# Patient Record
Sex: Female | Born: 1985 | Hispanic: Yes | Marital: Single | Smoking: Never smoker
Health system: Southern US, Community
[De-identification: ages and names within clinical notes are randomized; demographics above are authoritative.]

## PROBLEM LIST (undated history)

## (undated) HISTORY — PX: CHOLECYSTECTOMY: SHX55

## (undated) HISTORY — PX: TUBAL LIGATION: SHX77

---

## 2005-02-02 ENCOUNTER — Emergency Department: Payer: Self-pay | Admitting: Emergency Medicine

## 2005-08-25 ENCOUNTER — Ambulatory Visit: Payer: Self-pay | Admitting: Family Medicine

## 2009-12-20 ENCOUNTER — Emergency Department: Payer: Self-pay | Admitting: Internal Medicine

## 2010-03-10 ENCOUNTER — Emergency Department: Payer: Self-pay | Admitting: Unknown Physician Specialty

## 2010-03-28 ENCOUNTER — Observation Stay: Payer: Self-pay | Admitting: Obstetrics and Gynecology

## 2010-10-18 ENCOUNTER — Inpatient Hospital Stay: Payer: Self-pay

## 2010-12-24 ENCOUNTER — Emergency Department: Payer: Self-pay | Admitting: Internal Medicine

## 2011-07-28 ENCOUNTER — Emergency Department: Payer: Self-pay | Admitting: Emergency Medicine

## 2011-07-28 LAB — URINALYSIS, COMPLETE
Bacteria: NONE SEEN
Blood: NEGATIVE
Ph: 8 (ref 4.5–8.0)
Protein: NEGATIVE
RBC,UR: <1 /HPF (ref 0–5)
Specific Gravity: 1.02 (ref 1.003–1.030)
Squamous Epithelial: 7
WBC UR: 1 /HPF (ref 0–5)

## 2011-07-28 LAB — PREGNANCY, URINE: Pregnancy Test, Urine: POSITIVE m[IU]/mL

## 2011-10-20 ENCOUNTER — Inpatient Hospital Stay: Payer: Self-pay

## 2011-10-20 LAB — URINALYSIS, COMPLETE
Bacteria: NONE SEEN
Glucose,UR: NEGATIVE mg/dL (ref 0–75)
Nitrite: NEGATIVE
Protein: 100
Specific Gravity: 1.011 (ref 1.003–1.030)
Squamous Epithelial: 1
WBC UR: 610 /HPF (ref 0–5)

## 2011-10-21 LAB — CBC WITH DIFFERENTIAL/PLATELET
Basophil #: 0 10*3/uL (ref 0.0–0.1)
Basophil %: 0.3 %
Eosinophil %: 0.2 %
HCT: 31.3 % — ABNORMAL LOW (ref 35.0–47.0)
Lymphocyte #: 1.4 10*3/uL (ref 1.0–3.6)
Lymphocyte %: 9.7 %
MCH: 28.2 pg (ref 26.0–34.0)
MCHC: 33.2 g/dL (ref 32.0–36.0)
Monocyte #: 1.1 x10 3/mm — ABNORMAL HIGH (ref 0.2–0.9)
Neutrophil %: 82.3 %
RBC: 3.69 10*6/uL — ABNORMAL LOW (ref 3.80–5.20)
RDW: 14.7 % — ABNORMAL HIGH (ref 11.5–14.5)
WBC: 14.4 10*3/uL — ABNORMAL HIGH (ref 3.6–11.0)

## 2011-10-22 LAB — URINE CULTURE

## 2014-10-14 ENCOUNTER — Emergency Department
Admit: 2014-10-14 | Disposition: A | Discharge status: Home / Self Care | Payer: Self-pay | Admitting: Emergency Medicine

## 2014-10-14 LAB — CBC
HCT: 40.7 % (ref 35.0–47.0)
HGB: 13.5 g/dL (ref 12.0–16.0)
MCH: 29.2 pg (ref 26.0–34.0)
MCHC: 33.2 g/dL (ref 32.0–36.0)
MCV: 88 fL (ref 80–100)
Platelet: 237 10*3/uL (ref 150–440)
RBC: 4.64 10*6/uL (ref 3.80–5.20)
RDW: 12.9 % (ref 11.5–14.5)
WBC: 8.9 10*3/uL (ref 3.6–11.0)

## 2014-10-14 LAB — COMPREHENSIVE METABOLIC PANEL
ALT: 61 U/L — AB
AST: 54 U/L — AB
Albumin: 4.4 g/dL
Alkaline Phosphatase: 65 U/L
Anion Gap: 8 (ref 7–16)
BUN: 16 mg/dL
Bilirubin,Total: 0.6 mg/dL
CHLORIDE: 105 mmol/L
CO2: 28 mmol/L
Calcium, Total: 9.5 mg/dL
Creatinine: 0.66 mg/dL
Glucose: 93 mg/dL
POTASSIUM: 3.8 mmol/L
Sodium: 141 mmol/L
Total Protein: 7.8 g/dL

## 2014-10-14 LAB — URINALYSIS, COMPLETE
BILIRUBIN, UR: NEGATIVE
BLOOD: NEGATIVE
Glucose,UR: NEGATIVE mg/dL (ref 0–75)
Ketone: NEGATIVE
LEUKOCYTE ESTERASE: NEGATIVE
NITRITE: NEGATIVE
Ph: 6 (ref 4.5–8.0)
Protein: NEGATIVE
Specific Gravity: 1.025 (ref 1.003–1.030)

## 2014-10-14 LAB — PREGNANCY, URINE: Pregnancy Test, Urine: NEGATIVE m[IU]/mL

## 2014-10-30 NOTE — H&P (Signed)
L&D Evaluation:  History:   HPI 29 yo [id number] @ 26.1wks EDC 12/21/11 by 26wk uls presents c/o right flank pain x 1 day.  She reports a h/o "kidney infections" with 4 of her prior pregnancies.  She has not had a UTI this pregnancy.  Pain started last night and has been worsening since.  She is also having urinary frequency and burning, using the bathroom q 5-10 min.  Reports pain so bad today that it was hard for her to walk.  +FM, no LOF/VB or ctxs.  PNC @ [facility] c/b multiparity, h/o term fetal demise, PTD x 2, anti-C antibodies and late PNC started at 26wks.  No nausea/vomiting/fever/chills.  PNLs reviewed and all normal    Presents with back pain    Patient's Medical History Anti-C antibodies, Anemia, Pyelonephritis, Abnormal pap    Patient's Surgical History none    Medications Pre Natal Vitamins    Allergies NKDA    Social History none    Family History DM, HTN - pat GM   ROS:   ROS see HPI, all others reviewed and neg   Exam:   Vital Signs stable  T 99.1    Urine Protein UA:  1+bld, prot 100, nitr neg, 14 RBCs, 610 WBCs, 1 epith    General Obvious discomfort, holding her R side    Mental Status clear    Chest clear    Heart normal sinus rhythm    Abdomen gravid, non-tender    Back Exquisite R flank pain    Edema no edema    Pelvic Deferred    Mebranes Intact    FHT normal rate with no decels, reactive    Fetal Heart Rate 135     Ucx absent    Skin dry   Impression:   Impression 26.1wk IUP, pyelonephritis   Plan:   Comments 1)  Admit for antibiotics and pain control.  Ancef 2g IV q 24hrs started pending culture.  Percocet prn pain.  2 tabs given now. 2)  CBC drawn. 3)  Transfer to floor when pain improved.   Electronic Signatures: Senaida LangeWeaver-Lee, Eulis Salazar (MD)  (Signed 01-May-13 00:08)  Authored: L&D Evaluation   Last Updated: 01-May-13 00:08 by Senaida LangeWeaver-Lee, Byren Pankow (MD)

## 2019-03-04 DIAGNOSIS — R7989 Other specified abnormal findings of blood chemistry: Secondary | ICD-10-CM | POA: Insufficient documentation

## 2019-03-04 DIAGNOSIS — U071 COVID-19: Secondary | ICD-10-CM | POA: Insufficient documentation

## 2019-03-04 DIAGNOSIS — E669 Obesity, unspecified: Secondary | ICD-10-CM | POA: Insufficient documentation

## 2020-04-02 ENCOUNTER — Inpatient Hospital Stay
Admission: EM | Admit: 2020-04-02 | Discharge: 2020-04-05 | DRG: 419 | Disposition: A | Discharge status: Home / Self Care | Payer: Medicaid Other | Attending: Surgery | Admitting: Surgery

## 2020-04-02 ENCOUNTER — Emergency Department: Payer: Medicaid Other

## 2020-04-02 ENCOUNTER — Other Ambulatory Visit: Payer: Self-pay

## 2020-04-02 DIAGNOSIS — Z6834 Body mass index (BMI) 34.0-34.9, adult: Secondary | ICD-10-CM | POA: Diagnosis not present

## 2020-04-02 DIAGNOSIS — Z9851 Tubal ligation status: Secondary | ICD-10-CM

## 2020-04-02 DIAGNOSIS — R52 Pain, unspecified: Secondary | ICD-10-CM

## 2020-04-02 DIAGNOSIS — K810 Acute cholecystitis: Secondary | ICD-10-CM | POA: Diagnosis not present

## 2020-04-02 DIAGNOSIS — R1011 Right upper quadrant pain: Secondary | ICD-10-CM

## 2020-04-02 DIAGNOSIS — Z20822 Contact with and (suspected) exposure to covid-19: Secondary | ICD-10-CM | POA: Diagnosis present

## 2020-04-02 DIAGNOSIS — E669 Obesity, unspecified: Secondary | ICD-10-CM | POA: Diagnosis present

## 2020-04-02 DIAGNOSIS — K8000 Calculus of gallbladder with acute cholecystitis without obstruction: Principal | ICD-10-CM | POA: Diagnosis present

## 2020-04-02 DIAGNOSIS — K660 Peritoneal adhesions (postprocedural) (postinfection): Secondary | ICD-10-CM | POA: Diagnosis present

## 2020-04-02 LAB — COMPREHENSIVE METABOLIC PANEL
ALT: 84 U/L — ABNORMAL HIGH (ref 0–44)
AST: 135 U/L — ABNORMAL HIGH (ref 15–41)
Albumin: 4.5 g/dL (ref 3.5–5.0)
Alkaline Phosphatase: 85 U/L (ref 38–126)
Anion gap: 9 (ref 5–15)
BUN: 16 mg/dL (ref 6–20)
CO2: 25 mmol/L (ref 22–32)
Calcium: 9.3 mg/dL (ref 8.9–10.3)
Chloride: 101 mmol/L (ref 98–111)
Creatinine, Ser: 0.72 mg/dL (ref 0.44–1.00)
GFR, Estimated: >60 mL/min (ref >60)
Glucose, Bld: 117 mg/dL — ABNORMAL HIGH (ref 70–99)
Potassium: 3.8 mmol/L (ref 3.5–5.1)
Sodium: 135 mmol/L (ref 135–145)
Total Bilirubin: 1.6 mg/dL — ABNORMAL HIGH (ref 0.3–1.2)
Total Protein: 8 g/dL (ref 6.5–8.1)

## 2020-04-02 LAB — RESPIRATORY PANEL BY RT PCR (FLU A&B, COVID)
Influenza A by PCR: NEGATIVE
Influenza B by PCR: NEGATIVE
SARS Coronavirus 2 by RT PCR: NEGATIVE

## 2020-04-02 LAB — CBC
HCT: 39.4 % (ref 36.0–46.0)
Hemoglobin: 13.3 g/dL (ref 12.0–15.0)
MCH: 29.4 pg (ref 26.0–34.0)
MCHC: 33.8 g/dL (ref 30.0–36.0)
MCV: 87.2 fL (ref 80.0–100.0)
Platelets: 245 10*3/uL (ref 150–400)
RBC: 4.52 MIL/uL (ref 3.87–5.11)
RDW: 12.7 % (ref 11.5–15.5)
WBC: 15.9 10*3/uL — ABNORMAL HIGH (ref 4.0–10.5)
nRBC: 0 % (ref 0.0–0.2)

## 2020-04-02 LAB — LIPASE, BLOOD: Lipase: 28 U/L (ref 11–51)

## 2020-04-02 MED ORDER — HYDROMORPHONE HCL 1 MG/ML IJ SOLN
0.5000 mg | INTRAMUSCULAR | Status: DC | PRN
  Administered 2020-04-03 – 2020-04-04 (×5): 0.5 mg via INTRAVENOUS
  Filled 2020-04-02: qty 1
  Filled 2020-04-02: qty 0.5
  Filled 2020-04-02 (×2): qty 1
  Filled 2020-04-02: qty 0.5

## 2020-04-02 MED ORDER — HYDROMORPHONE HCL 1 MG/ML IJ SOLN
1.0000 mg | Freq: Once | INTRAMUSCULAR | Status: AC
  Administered 2020-04-02: 1 mg via INTRAVENOUS
  Filled 2020-04-02: qty 1

## 2020-04-02 MED ORDER — ONDANSETRON HCL 4 MG/2ML IJ SOLN: 4.0000 mg | Freq: Four times a day (QID) | INTRAMUSCULAR | Status: DC | PRN

## 2020-04-02 MED ORDER — ONDANSETRON HCL 4 MG/2ML IJ SOLN
4.0000 mg | Freq: Once | INTRAMUSCULAR | Status: AC
  Administered 2020-04-02: 4 mg via INTRAVENOUS
  Filled 2020-04-02: qty 2

## 2020-04-02 MED ORDER — ACETAMINOPHEN 500 MG PO TABS
1000.0000 mg | ORAL_TABLET | Freq: Four times a day (QID) | ORAL | Status: DC
  Administered 2020-04-02 – 2020-04-05 (×8): 1000 mg via ORAL
  Filled 2020-04-02 (×9): qty 2

## 2020-04-02 MED ORDER — LACTATED RINGERS IV SOLN
125.0000 mL/h | INTRAVENOUS | Status: DC
  Administered 2020-04-02 – 2020-04-05 (×3): 125 mL/h via INTRAVENOUS

## 2020-04-02 MED ORDER — FENTANYL CITRATE (PF) 100 MCG/2ML IJ SOLN
INTRAMUSCULAR | Status: AC
  Administered 2020-04-02: 50 ug via NASAL
  Filled 2020-04-02: qty 2

## 2020-04-02 MED ORDER — ONDANSETRON 4 MG PO TBDP: 4.0000 mg | ORAL_TABLET | Freq: Four times a day (QID) | ORAL | Status: DC | PRN

## 2020-04-02 MED ORDER — ENOXAPARIN SODIUM 40 MG/0.4ML ~~LOC~~ SOLN: 40.0000 mg | SUBCUTANEOUS | Status: DC

## 2020-04-02 MED ORDER — SODIUM CHLORIDE 0.9 % IV BOLUS
1000.0000 mL | Freq: Once | INTRAVENOUS | Status: AC
  Administered 2020-04-02: 1000 mL via INTRAVENOUS

## 2020-04-02 MED ORDER — PIPERACILLIN-TAZOBACTAM 3.375 G IVPB
3.3750 g | Freq: Three times a day (TID) | INTRAVENOUS | Status: DC
  Administered 2020-04-03 – 2020-04-05 (×6): 3.375 g via INTRAVENOUS
  Filled 2020-04-02 (×6): qty 50

## 2020-04-02 MED ORDER — PIPERACILLIN-TAZOBACTAM 3.375 G IVPB 30 MIN
3.3750 g | Freq: Once | INTRAVENOUS | Status: AC
  Administered 2020-04-02: 3.375 g via INTRAVENOUS
  Filled 2020-04-02: qty 50

## 2020-04-02 MED ORDER — PANTOPRAZOLE SODIUM 40 MG IV SOLR
40.0000 mg | Freq: Every day | INTRAVENOUS | Status: DC
  Administered 2020-04-02 – 2020-04-04 (×3): 40 mg via INTRAVENOUS
  Filled 2020-04-02 (×4): qty 40

## 2020-04-02 MED ORDER — FENTANYL CITRATE (PF) 100 MCG/2ML IJ SOLN: 50.0000 ug | INTRAMUSCULAR | Status: DC | PRN

## 2020-04-02 MED ORDER — POLYETHYLENE GLYCOL 3350 17 G PO PACK: 17.0000 g | PACK | Freq: Every day | ORAL | Status: DC | PRN

## 2020-04-02 MED ORDER — KETOROLAC TROMETHAMINE 30 MG/ML IJ SOLN
30.0000 mg | Freq: Four times a day (QID) | INTRAMUSCULAR | Status: DC
  Administered 2020-04-02 – 2020-04-03 (×3): 30 mg via INTRAVENOUS
  Filled 2020-04-02 (×3): qty 1

## 2020-04-02 NOTE — Progress Notes (Signed)
04/02/20  Called by Dr. Erma Heritage about this patient.  Presented with epigastric/RUQ abdominal pain after eating [employer] yesterday.  Labwork showed WBC of 15.9, with total bili of 1.6, AST 135, ALT 84, AlkPhos 85, lipase 28.  U/S showed acute cholecystitis with gallbladder wall thickening and 2 cm stone lodged in the neck of the gallbladder.  CBD is 7 mm, but there's no gross biliary dilatation or CBD filling defects.  Will admit to surgery, check labs in AM, possibly schedule for cholecystectomy.  Full H&P to follow in AM.  Henrene Dodge, MD

## 2020-04-02 NOTE — ED Triage Notes (Signed)
Pt was seen at [facility] for low back pain yesterday, prescribed muscle relaxer's which made pt sick. PT states now pain is in epigastric region. PT fidgeting and groaning, clearly uncomfortable. PT states only N/V was after medicine. NO urinary or vaginal sx.

## 2020-04-02 NOTE — ED Provider Notes (Signed)
[facility] Emergency Department Provider Note  ____________________________________________   First MD Initiated Contact with Patient 04/02/20 2121     (approximate)  I have reviewed the triage vital signs and the nursing notes.   HISTORY  Chief Complaint Abdominal Pain and Back Pain    HPI Carol Wilkerson is a 34 y.o. female  Here with abd pain. Pt reports that for the past several months, she's been having intermittent epigastric/RUQ abd pain, worse after eating. Has been to outside ED multiple times and dx with MSk pain, for which she has been having PT and been on muscle relaxants. Reports she ate [employer] yesterday, and after that had acute onset severe aching, gnawing epigastric abd pain. She has had associated nausea, no emesis. Pain is worse w/ attempted eating, drinking. No alleviating factors. She went to OSH ED overnight and was dx with MSK pain, given meds and sent home. She's had ongoing pain since then. No known fevers.        History reviewed. No pertinent past medical history.  Patient Active Problem List   Diagnosis Date Noted  . Acute cholecystitis 04/02/2020    Past Surgical History:  Procedure Laterality Date  . TUBAL LIGATION      Prior to Admission medications   Not on File    Allergies Patient has no known allergies.  No family history on file.  Social History Social History   Tobacco Use  . Smoking status: Never Smoker  . Smokeless tobacco: Never Used  Substance Use Topics  . Alcohol use: Never  . Drug use: Never    Review of Systems  Review of Systems  Constitutional: Positive for fatigue. Negative for fever.  HENT: Negative for congestion and sore throat.   Eyes: Negative for visual disturbance.  Respiratory: Negative for cough and shortness of breath.   Cardiovascular: Negative for chest pain.  Gastrointestinal: Positive for abdominal pain, nausea and vomiting. Negative for diarrhea.    Genitourinary: Negative for flank pain.  Musculoskeletal: Negative for back pain and neck pain.  Skin: Negative for rash and wound.  Neurological: Negative for weakness.  All other systems reviewed and are negative.    ____________________________________________  PHYSICAL EXAM:      VITAL SIGNS: ED Triage Vitals  Enc Vitals Group     BP 04/02/20 1757 121/70     Pulse Rate 04/02/20 1757 66     Resp 04/02/20 1757 18     Temp 04/02/20 1757 98.8 F (37.1 C)     Temp Source 04/02/20 1757 Oral     SpO2 04/02/20 1757 99 %     Weight 04/02/20 1801 170 lb (77.1 kg)     Height 04/02/20 1801 4\' 11"  (1.499 m)     Head Circumference --      Peak Flow --      Pain Score 04/02/20 1801 10     Pain Loc --      Pain Edu? --      Excl. in GC? --      Physical Exam Vitals and nursing note reviewed.  Constitutional:      General: She is not in acute distress.    Appearance: She is well-developed.  HENT:     Head: Normocephalic and atraumatic.  Eyes:     Conjunctiva/sclera: Conjunctivae normal.  Cardiovascular:     Rate and Rhythm: Normal rate and regular rhythm.     Heart sounds: Normal heart sounds. No murmur heard.  No friction  rub.  Pulmonary:     Effort: Pulmonary effort is normal. No respiratory distress.     Breath sounds: Normal breath sounds. No wheezing or rales.  Abdominal:     General: There is no distension.     Palpations: Abdomen is soft.     Tenderness: There is abdominal tenderness in the right upper quadrant. Positive signs include Murphy's sign.  Musculoskeletal:     Cervical back: Neck supple.  Skin:    General: Skin is warm.     Capillary Refill: Capillary refill takes less than 2 seconds.  Neurological:     Mental Status: She is alert and oriented to person, place, and time.     Motor: No abnormal muscle tone.       ____________________________________________   LABS (all labs ordered are listed, but only abnormal results are displayed)  Labs  Reviewed  COMPREHENSIVE METABOLIC PANEL - Abnormal; Notable for the following components:      Result Value   Glucose, Bld 117 (*)    AST 135 (*)    ALT 84 (*)    Total Bilirubin 1.6 (*)    All other components within normal limits  CBC - Abnormal; Notable for the following components:   WBC 15.9 (*)    All other components within normal limits  RESPIRATORY PANEL BY RT PCR (FLU A&B, COVID)  LIPASE, BLOOD  URINALYSIS, COMPLETE (UACMP) WITH MICROSCOPIC  HIV ANTIBODY (ROUTINE TESTING W REFLEX)  MAGNESIUM  CBC  BASIC METABOLIC PANEL  HEPATIC FUNCTION PANEL  POC URINE PREG, ED    ____________________________________________  EKG:  ________________________________________  RADIOLOGY All imaging, including plain films, CT scans, and ultrasounds, independently reviewed by me, and interpretations confirmed via formal radiology reads.  ED MD interpretation:   [place]: 2 cm stone in GB neck, non mobile, with signs of cholecystitis  Official radiology report(s): US Abdomen Limited RUQ  Result Date: 04/02/2020 CLINICAL DATA:  Two days of pain, nausea and vomiting EXAM: ULTRASOUND ABDOMEN LIMITED RIGHT UPPER QUADRANT COMPARISON:  Abdominal ultrasound 08/25/2005 FINDINGS: Gallbladder: Calcified, shadowing gallstone measuring up to 2 cm positioned within the neck of the gallbladder is nonmobile and patient repositioning. Gallbladder wall thickening to 4.4 mm. Sonographic Eulah Pont sign is reportedly positive. No pericholecystic fluid is visible. Common bile duct: Diameter: 7 mm, borderline dilated. No intrahepatic biliary ductal dilatation is seen. Liver: Diffusely increased hepatic echogenicity with loss of definition of the portal triads and diminished posterior through transmission compatible with hepatic steatosis. No focal lesion identified. Portal vein is patent on color Doppler imaging with normal direction of blood flow towards the liver. Other: None. IMPRESSION: 1. Non-mobile 2 cm stone in the  neck of the gallbladder with gallbladder wall thickening and a positive sonographic Murphy sign. Findings are compatible with cholelithiasis and acute cholecystitis in the appropriate clinical setting. 2. Borderline dilatation of the common bile duct. No intrahepatic biliary ductal dilatation is seen. 3. Increased hepatic echogenicity, most often compatible with hepatic steatosis. Electronically Signed   By: Kreg Shropshire M.D.   On: 04/02/2020 19:08    ____________________________________________  PROCEDURES   Procedure(s) performed (including Critical Care):  Procedures  ____________________________________________  INITIAL IMPRESSION / MDM / ASSESSMENT AND PLAN / ED COURSE  As part of my medical decision making, I reviewed the following data within the electronic MEDICAL RECORD NUMBER Nursing notes reviewed and incorporated, Old chart reviewed, Notes from prior ED visits, and [place] Controlled Substance Database       *Renay Crammer was  evaluated in Emergency Department on 04/02/2020 for the symptoms described in the history of present illness. She was evaluated in the context of the global COVID-19 pandemic, which necessitated consideration that the patient might be at risk for infection with the SARS-CoV-2 virus that causes COVID-19. Institutional protocols and algorithms that pertain to the evaluation of patients at risk for COVID-19 are in a state of rapid change based on information released by regulatory bodies including the CDC and federal and state organizations. These policies and algorithms were followed during the patient's care in the ED.  Some ED evaluations and interventions may be delayed as a result of limited staffing during the pandemic.*     Medical Decision Making:  34 yo F here with RUQ pain, likely 2/2 biliary colic with signs of early cholecystitis. Labs show mild LFT elevation, leukocytosis. Lipase wnl. CBD slightly dilated. Discussed with Dr. Aleen Campi, will start on  empiric ABX, fluids, and admit. COVID pending.  ____________________________________________  FINAL CLINICAL IMPRESSION(S) / ED DIAGNOSES  Final diagnoses:  Pain  Acute cholecystitis     MEDICATIONS GIVEN DURING THIS VISIT:  Medications  fentaNYL (SUBLIMAZE) injection 50 mcg (50 mcg Nasal Given 04/02/20 1832)  piperacillin-tazobactam (ZOSYN) IVPB 3.375 g (3.375 g Intravenous New Bag/Given 04/02/20 2202)  lactated ringers infusion (has no administration in time range)  acetaminophen (TYLENOL) tablet 1,000 mg (has no administration in time range)  ketorolac (TORADOL) 30 MG/ML injection 30 mg (has no administration in time range)  HYDROmorphone (DILAUDID) injection 0.5 mg (has no administration in time range)  polyethylene glycol (MIRALAX / GLYCOLAX) packet 17 g (has no administration in time range)  ondansetron (ZOFRAN-ODT) disintegrating tablet 4 mg (has no administration in time range)    Or  ondansetron (ZOFRAN) injection 4 mg (has no administration in time range)  pantoprazole (PROTONIX) injection 40 mg (has no administration in time range)  enoxaparin (LOVENOX) injection 40 mg (has no administration in time range)  piperacillin-tazobactam (ZOSYN) IVPB 3.375 g (has no administration in time range)  sodium chloride 0.9 % bolus 1,000 mL (1,000 mLs Intravenous New Bag/Given 04/02/20 2201)  HYDROmorphone (DILAUDID) injection 1 mg (1 mg Intravenous Given 04/02/20 2202)  ondansetron (ZOFRAN) injection 4 mg (4 mg Intravenous Given 04/02/20 2201)     ED Discharge Orders    None       Note:  This document was prepared using Dragon voice recognition software and may include unintentional dictation errors.   Shaune Pollack, MD 04/02/20 2210

## 2020-04-03 ENCOUNTER — Inpatient Hospital Stay: Payer: Medicaid Other

## 2020-04-03 ENCOUNTER — Encounter: Payer: Self-pay | Admitting: Surgery

## 2020-04-03 ENCOUNTER — Encounter
Admission: EM | Disposition: A | Discharge status: Home / Self Care | Payer: Self-pay | Source: Home / Self Care | Attending: Surgery

## 2020-04-03 ENCOUNTER — Inpatient Hospital Stay: Payer: Medicaid Other | Admitting: Certified Registered"

## 2020-04-03 DIAGNOSIS — K810 Acute cholecystitis: Secondary | ICD-10-CM

## 2020-04-03 LAB — POC URINE PREG, ED: Preg Test, Ur: NEGATIVE

## 2020-04-03 LAB — CBC
HCT: 36.2 % (ref 36.0–46.0)
Hemoglobin: 12.3 g/dL (ref 12.0–15.0)
MCH: 29.4 pg (ref 26.0–34.0)
MCHC: 34 g/dL (ref 30.0–36.0)
MCV: 86.4 fL (ref 80.0–100.0)
Platelets: 208 10*3/uL (ref 150–400)
RBC: 4.19 MIL/uL (ref 3.87–5.11)
RDW: 12.6 % (ref 11.5–15.5)
WBC: 13.5 10*3/uL — ABNORMAL HIGH (ref 4.0–10.5)
nRBC: 0 % (ref 0.0–0.2)

## 2020-04-03 LAB — URINALYSIS, COMPLETE (UACMP) WITH MICROSCOPIC
Bilirubin Urine: NEGATIVE
Glucose, UA: NEGATIVE mg/dL
Hgb urine dipstick: NEGATIVE
Ketones, ur: NEGATIVE mg/dL
Leukocytes,Ua: NEGATIVE
Nitrite: NEGATIVE
Protein, ur: NEGATIVE mg/dL
Specific Gravity, Urine: 1.031 — ABNORMAL HIGH (ref 1.005–1.030)
pH: 6 (ref 5.0–8.0)

## 2020-04-03 LAB — BASIC METABOLIC PANEL
Anion gap: 7 (ref 5–15)
BUN: 11 mg/dL (ref 6–20)
CO2: 27 mmol/L (ref 22–32)
Calcium: 8.8 mg/dL — ABNORMAL LOW (ref 8.9–10.3)
Chloride: 102 mmol/L (ref 98–111)
Creatinine, Ser: 0.53 mg/dL (ref 0.44–1.00)
GFR, Estimated: >60 mL/min (ref >60)
Glucose, Bld: 127 mg/dL — ABNORMAL HIGH (ref 70–99)
Potassium: 3.6 mmol/L (ref 3.5–5.1)
Sodium: 136 mmol/L (ref 135–145)

## 2020-04-03 LAB — HEPATIC FUNCTION PANEL
ALT: 338 U/L — ABNORMAL HIGH (ref 0–44)
AST: 382 U/L — ABNORMAL HIGH (ref 15–41)
Albumin: 3.8 g/dL (ref 3.5–5.0)
Alkaline Phosphatase: 108 U/L (ref 38–126)
Bilirubin, Direct: 1.1 mg/dL — ABNORMAL HIGH (ref 0.0–0.2)
Indirect Bilirubin: 1.9 mg/dL — ABNORMAL HIGH (ref 0.3–0.9)
Total Bilirubin: 3 mg/dL — ABNORMAL HIGH (ref 0.3–1.2)
Total Protein: 7 g/dL (ref 6.5–8.1)

## 2020-04-03 LAB — HIV ANTIBODY (ROUTINE TESTING W REFLEX): HIV Screen 4th Generation wRfx: NONREACTIVE

## 2020-04-03 LAB — MAGNESIUM: Magnesium: 1.9 mg/dL (ref 1.7–2.4)

## 2020-04-03 SURGERY — CHOLECYSTECTOMY, ROBOT-ASSISTED, LAPAROSCOPIC
Anesthesia: General

## 2020-04-03 MED ORDER — LIDOCAINE HCL (PF) 2 % IJ SOLN
INTRAMUSCULAR | Status: AC
  Filled 2020-04-03: qty 5

## 2020-04-03 MED ORDER — PROPOFOL 10 MG/ML IV BOLUS
INTRAVENOUS | Status: AC
  Filled 2020-04-03: qty 20

## 2020-04-03 MED ORDER — MIDAZOLAM HCL 2 MG/2ML IJ SOLN
INTRAMUSCULAR | Status: AC
  Filled 2020-04-03: qty 2

## 2020-04-03 MED ORDER — FENTANYL CITRATE (PF) 100 MCG/2ML IJ SOLN
25.0000 ug | INTRAMUSCULAR | Status: DC | PRN
  Administered 2020-04-03 (×3): 25 ug via INTRAVENOUS

## 2020-04-03 MED ORDER — DEXAMETHASONE SODIUM PHOSPHATE 10 MG/ML IJ SOLN
INTRAMUSCULAR | Status: DC | PRN
  Administered 2020-04-03: 10 mg via INTRAVENOUS

## 2020-04-03 MED ORDER — INDOCYANINE GREEN 25 MG IV SOLR
2.5000 mg | INTRAVENOUS | Status: AC
  Administered 2020-04-03: 2.5 mg via INTRAVENOUS
  Filled 2020-04-03: qty 1

## 2020-04-03 MED ORDER — KETAMINE HCL 50 MG/ML IJ SOLN
INTRAMUSCULAR | Status: DC | PRN
  Administered 2020-04-03: 25 mg via INTRAMUSCULAR

## 2020-04-03 MED ORDER — GADOBUTROL 1 MMOL/ML IV SOLN
7.0000 mL | Freq: Once | INTRAVENOUS | Status: AC | PRN
  Administered 2020-04-03: 7 mL via INTRAVENOUS
  Filled 2020-04-03: qty 7.5

## 2020-04-03 MED ORDER — ROCURONIUM BROMIDE 10 MG/ML (PF) SYRINGE
PREFILLED_SYRINGE | INTRAVENOUS | Status: AC
  Filled 2020-04-03: qty 10

## 2020-04-03 MED ORDER — FENTANYL CITRATE (PF) 100 MCG/2ML IJ SOLN
INTRAMUSCULAR | Status: DC | PRN
  Administered 2020-04-03 (×4): 50 ug via INTRAVENOUS

## 2020-04-03 MED ORDER — KETAMINE HCL 50 MG/ML IJ SOLN
INTRAMUSCULAR | Status: AC
  Filled 2020-04-03: qty 10

## 2020-04-03 MED ORDER — ONDANSETRON HCL 4 MG/2ML IJ SOLN: 4.0000 mg | Freq: Once | INTRAMUSCULAR | Status: DC | PRN

## 2020-04-03 MED ORDER — SEVOFLURANE IN SOLN
RESPIRATORY_TRACT | Status: AC
  Filled 2020-04-03: qty 250

## 2020-04-03 MED ORDER — PIPERACILLIN-TAZOBACTAM 3.375 G IVPB
INTRAVENOUS | Status: AC
  Administered 2020-04-03: 3.375 g via INTRAVENOUS
  Filled 2020-04-03: qty 50

## 2020-04-03 MED ORDER — DEXMEDETOMIDINE (PRECEDEX) IN NS 20 MCG/5ML (4 MCG/ML) IV SYRINGE
PREFILLED_SYRINGE | INTRAVENOUS | Status: DC | PRN
  Administered 2020-04-03 (×3): 4 ug via INTRAVENOUS

## 2020-04-03 MED ORDER — OXYCODONE HCL 5 MG PO TABS
5.0000 mg | ORAL_TABLET | ORAL | Status: DC | PRN
  Administered 2020-04-04: 10 mg via ORAL
  Administered 2020-04-04: 5 mg via ORAL
  Filled 2020-04-03: qty 1
  Filled 2020-04-03: qty 2

## 2020-04-03 MED ORDER — DEXMEDETOMIDINE (PRECEDEX) IN NS 20 MCG/5ML (4 MCG/ML) IV SYRINGE
PREFILLED_SYRINGE | INTRAVENOUS | Status: AC
  Filled 2020-04-03: qty 5

## 2020-04-03 MED ORDER — ROCURONIUM BROMIDE 100 MG/10ML IV SOLN
INTRAVENOUS | Status: DC | PRN
  Administered 2020-04-03: 10 mg via INTRAVENOUS
  Administered 2020-04-03 (×2): 20 mg via INTRAVENOUS
  Administered 2020-04-03: 30 mg via INTRAVENOUS

## 2020-04-03 MED ORDER — FENTANYL CITRATE (PF) 100 MCG/2ML IJ SOLN
INTRAMUSCULAR | Status: AC
Start: 2020-04-03 — End: ?
  Filled 2020-04-03: qty 2

## 2020-04-03 MED ORDER — ONDANSETRON HCL 4 MG/2ML IJ SOLN
INTRAMUSCULAR | Status: DC | PRN
  Administered 2020-04-03: 4 mg via INTRAVENOUS

## 2020-04-03 MED ORDER — BUPIVACAINE-EPINEPHRINE (PF) 0.25% -1:200000 IJ SOLN
INTRAMUSCULAR | Status: DC | PRN
  Administered 2020-04-03: 30 mL

## 2020-04-03 MED ORDER — ONDANSETRON HCL 4 MG/2ML IJ SOLN
INTRAMUSCULAR | Status: AC
  Filled 2020-04-03: qty 2

## 2020-04-03 MED ORDER — PHENYLEPHRINE HCL (PRESSORS) 10 MG/ML IV SOLN
INTRAVENOUS | Status: DC | PRN
  Administered 2020-04-03: 100 ug via INTRAVENOUS
  Administered 2020-04-03 (×2): 200 ug via INTRAVENOUS
  Administered 2020-04-03: 100 ug via INTRAVENOUS

## 2020-04-03 MED ORDER — NORETHINDRONE 0.35 MG PO TABS: 1.0000 | ORAL_TABLET | Freq: Every day | ORAL | Status: DC

## 2020-04-03 MED ORDER — LIDOCAINE HCL (CARDIAC) PF 100 MG/5ML IV SOSY
PREFILLED_SYRINGE | INTRAVENOUS | Status: DC | PRN
  Administered 2020-04-03: 80 mg via INTRAVENOUS

## 2020-04-03 MED ORDER — BUPIVACAINE-EPINEPHRINE (PF) 0.25% -1:200000 IJ SOLN
INTRAMUSCULAR | Status: AC
  Filled 2020-04-03: qty 30

## 2020-04-03 MED ORDER — MIDAZOLAM HCL 2 MG/2ML IJ SOLN
INTRAMUSCULAR | Status: DC | PRN
  Administered 2020-04-03: 2 mg via INTRAVENOUS

## 2020-04-03 MED ORDER — SUCCINYLCHOLINE CHLORIDE 20 MG/ML IJ SOLN
INTRAMUSCULAR | Status: DC | PRN
  Administered 2020-04-03: 180 mg via INTRAVENOUS

## 2020-04-03 MED ORDER — SUGAMMADEX SODIUM 200 MG/2ML IV SOLN
INTRAVENOUS | Status: DC | PRN
  Administered 2020-04-03: 160 mg via INTRAVENOUS

## 2020-04-03 MED ORDER — DEXAMETHASONE SODIUM PHOSPHATE 10 MG/ML IJ SOLN
INTRAMUSCULAR | Status: AC
  Filled 2020-04-03: qty 1

## 2020-04-03 MED ORDER — FENTANYL CITRATE (PF) 100 MCG/2ML IJ SOLN
INTRAMUSCULAR | Status: AC
  Administered 2020-04-03: 25 ug via INTRAVENOUS
  Filled 2020-04-03: qty 2

## 2020-04-03 MED ORDER — FENTANYL CITRATE (PF) 100 MCG/2ML IJ SOLN
INTRAMUSCULAR | Status: AC
  Filled 2020-04-03: qty 2

## 2020-04-03 MED ORDER — PROPOFOL 10 MG/ML IV BOLUS
INTRAVENOUS | Status: DC | PRN
  Administered 2020-04-03: 150 mg via INTRAVENOUS

## 2020-04-03 SURGICAL SUPPLY — 53 items
BAG INFUSER PRESSURE 100CC (MISCELLANEOUS) IMPLANT
CANISTER SUCT 1200ML W/VALVE (MISCELLANEOUS) ×3 IMPLANT
CANNULA REDUC XI 12-8 STAPL (CANNULA) ×1
CANNULA REDUC XI 12-8MM STAPL (CANNULA) ×1
CANNULA REDUCER 12-8 DVNC XI (CANNULA) ×1 IMPLANT
CHLORAPREP W/TINT 26 (MISCELLANEOUS) ×3 IMPLANT
CLIP VESOLOCK MED LG 6/CT (CLIP) ×6 IMPLANT
COVER WAND RF STERILE (DRAPES) ×3 IMPLANT
CUP MEDICINE 2OZ PLAST GRAD ST (MISCELLANEOUS) ×3 IMPLANT
DECANTER SPIKE VIAL GLASS SM (MISCELLANEOUS) ×3 IMPLANT
DEFOGGER SCOPE WARMER CLEARIFY (MISCELLANEOUS) ×3 IMPLANT
DERMABOND ADVANCED (GAUZE/BANDAGES/DRESSINGS) ×2
DERMABOND ADVANCED .7 DNX12 (GAUZE/BANDAGES/DRESSINGS) ×1 IMPLANT
DRAPE ARM DVNC X/XI (DISPOSABLE) ×4 IMPLANT
DRAPE COLUMN DVNC XI (DISPOSABLE) ×1 IMPLANT
DRAPE DA VINCI XI ARM (DISPOSABLE) ×8
DRAPE DA VINCI XI COLUMN (DISPOSABLE) ×2
ELECT CAUTERY BLADE TIP 2.5 (TIP) ×3
ELECT REM PT RETURN 9FT ADLT (ELECTROSURGICAL) ×3
ELECTRODE CAUTERY BLDE TIP 2.5 (TIP) ×1 IMPLANT
ELECTRODE REM PT RTRN 9FT ADLT (ELECTROSURGICAL) ×1 IMPLANT
GLOVE SURG SYN 7.0 (GLOVE) ×6 IMPLANT
GLOVE SURG SYN 7.5  E (GLOVE) ×4
GLOVE SURG SYN 7.5 E (GLOVE) ×2 IMPLANT
GOWN STRL REUS W/ TWL LRG LVL3 (GOWN DISPOSABLE) ×4 IMPLANT
GOWN STRL REUS W/TWL LRG LVL3 (GOWN DISPOSABLE) ×8
IRRIGATOR SUCT 8 DISP DVNC XI (IRRIGATION / IRRIGATOR) ×1 IMPLANT
IRRIGATOR SUCTION 8MM XI DISP (IRRIGATION / IRRIGATOR) ×2
IV NS 1000ML (IV SOLUTION)
IV NS 1000ML BAXH (IV SOLUTION) IMPLANT
KIT PINK PAD W/HEAD ARE REST (MISCELLANEOUS) ×3
KIT PINK PAD W/HEAD ARM REST (MISCELLANEOUS) ×1 IMPLANT
LABEL OR SOLS (LABEL) ×3 IMPLANT
NEEDLE HYPO 22GX1.5 SAFETY (NEEDLE) ×3 IMPLANT
NS IRRIG 500ML POUR BTL (IV SOLUTION) ×3 IMPLANT
OBTURATOR OPTICAL STANDARD 8MM (TROCAR) ×2
OBTURATOR OPTICAL STND 8 DVNC (TROCAR) ×1
OBTURATOR OPTICALSTD 8 DVNC (TROCAR) ×1 IMPLANT
PACK LAP CHOLECYSTECTOMY (MISCELLANEOUS) ×3 IMPLANT
PENCIL ELECTRO HAND CTR (MISCELLANEOUS) ×3 IMPLANT
POUCH SPECIMEN RETRIEVAL 10MM (ENDOMECHANICALS) ×3 IMPLANT
SEAL CANN UNIV 5-8 DVNC XI (MISCELLANEOUS) ×4 IMPLANT
SEAL XI 5MM-8MM UNIVERSAL (MISCELLANEOUS) ×8
SET TUBE SMOKE EVAC HIGH FLOW (TUBING) ×3 IMPLANT
SOLUTION ELECTROLUBE (MISCELLANEOUS) ×3 IMPLANT
SPONGE LAP 18X18 RF (DISPOSABLE) IMPLANT
SPONGE LAP 4X18 RFD (DISPOSABLE) ×3 IMPLANT
STAPLER CANNULA SEAL DVNC XI (STAPLE) ×1 IMPLANT
STAPLER CANNULA SEAL XI (STAPLE) ×2
SUT MNCRL AB 4-0 PS2 18 (SUTURE) ×3 IMPLANT
SUT VICRYL 0 AB UR-6 (SUTURE) ×6 IMPLANT
TAPE TRANSPORE STRL 2 31045 (GAUZE/BANDAGES/DRESSINGS) ×3 IMPLANT
TROCAR BALLN GELPORT 12X130M (ENDOMECHANICALS) ×3 IMPLANT

## 2020-04-03 NOTE — ED Notes (Signed)
Report given to OR.

## 2020-04-03 NOTE — Anesthesia Preprocedure Evaluation (Addendum)
Anesthesia Evaluation  Patient identified by MRN, date of birth, ID band Patient awake    Reviewed: Allergy & Precautions, NPO status , Patient's Chart, lab work & pertinent test results  History of Anesthesia Complications Negative for: history of anesthetic complications  Airway Mallampati: II       Dental   Pulmonary neg sleep apnea, neg COPD,           Cardiovascular (-) hypertension(-) Past MI and (-) CHF (-) dysrhythmias (-) Valvular Problems/Murmurs     Neuro/Psych neg Seizures    GI/Hepatic Neg liver ROS, GERD  Medicated and Poorly Controlled,  Endo/Other  neg diabetes  Renal/GU negative Renal ROS     Musculoskeletal   Abdominal (+) + obese,   Peds  Hematology   Anesthesia Other Findings   Reproductive/Obstetrics                            Anesthesia Physical Anesthesia Plan  ASA: II  Anesthesia Plan: General   Post-op Pain Management:    Induction: Intravenous and Rapid sequence  PONV Risk Score and Plan: 3  Airway Management Planned: Oral ETT  Additional Equipment:   Intra-op Plan:   Post-operative Plan:   Informed Consent: I have reviewed the patients History and Physical, chart, labs and discussed the procedure including the risks, benefits and alternatives for the proposed anesthesia with the patient or authorized representative who has indicated his/her understanding and acceptance.       Plan Discussed with:   Anesthesia Plan Comments:         Anesthesia Quick Evaluation

## 2020-04-03 NOTE — ED Notes (Signed)
Pt back from MRI 

## 2020-04-03 NOTE — ED Notes (Signed)
Pt taken to MRI  

## 2020-04-03 NOTE — Anesthesia Procedure Notes (Signed)
Procedure Name: Intubation Date/Time: 04/03/2020 4:15 PM Performed by: Felix Ahmadi, RN Pre-anesthesia Checklist: Patient identified, Emergency Drugs available, Suction available and Patient being monitored Patient Re-evaluated:Patient Re-evaluated prior to induction Oxygen Delivery Method: Circle system utilized Preoxygenation: Pre-oxygenation with 100% oxygen Induction Type: IV induction Ventilation: Mask ventilation without difficulty Laryngoscope Size: McGraph and 3 Grade View: Grade I Tube type: Oral Tube size: 7.0 mm Number of attempts: 1 Airway Equipment and Method: Stylet and Oral airway Placement Confirmation: ETT inserted through vocal cords under direct vision,  positive ETCO2 and breath sounds checked- equal and bilateral Secured at: 21 cm Tube secured with: Tape Dental Injury: Teeth and Oropharynx as per pre-operative assessment

## 2020-04-03 NOTE — ED Notes (Signed)
Per OR, patient is scheduled at 1455, coming to take patient to preop in a few minutes

## 2020-04-03 NOTE — ED Notes (Signed)
Surgery at bedside.

## 2020-04-03 NOTE — Transfer of Care (Signed)
Immediate Anesthesia Transfer of Care Note  Patient: Lerae Langham Sosa  Procedure(s) Performed: XI ROBOTIC ASSISTED LAPAROSCOPIC CHOLECYSTECTOMY (N/A )  Patient Location: PACU  Anesthesia Type:General  Level of Consciousness: sedated  Airway & Oxygen Therapy: Patient Spontanous Breathing and Patient connected to face mask oxygen  Post-op Assessment: Report given to RN and Post -op Vital signs reviewed and stable  Post vital signs: Reviewed and stable  Last Vitals:  Vitals Value Taken Time  BP 118/62 04/03/20 1938  Temp 36.9 C 04/03/20 1938  Pulse 95 04/03/20 1942  Resp 18 04/03/20 1942  SpO2 100 % 04/03/20 1942  Vitals shown include unvalidated device data.  Last Pain:  Vitals:   04/03/20 1938  TempSrc:   PainSc: Asleep      Patients Stated Pain Goal: 0 (04/03/20 0349)  Complications: No complications documented.

## 2020-04-03 NOTE — H&P (Signed)
[facility] SURGICAL HISTORY & PHYSICAL (cpt [id number])  HISTORY OF PRESENT ILLNESS (HPI):  34 y.o. female presented to [facility] ED yesterday (10/12) for abdominal pain. Patient reports she had the acute onset of upper abdominal pain on Friday night after eating fried chicken. This pain has been in her epigastric and RUQ since the onset. This is a sharp pain and constant. She has associated nausea and emesis with this pain. No fever, chills, cough, CP, SOB, urinary changes, or bowel changes. She actually reports presenting to [facility] for this on Sunday night but was reportedly only treated for musculoskeletal pains. No history of similar pain in the past. Only previous abdominal surgery is tubal ligation. Initial laboratory work up revealed leukocytosis to 15.9k (which is improved to 13.5K this morning), renal function was normal, she did have hyperbilirubinemia to 1.6 which is now slightly worse at 3.0 which is primarily indirect. RUQ [place] was concerning for cholelithiasis without gross evidence of choledocholithiasis. She did ultimately have MRCP given increase in hyperbilirubinemia which was negative for choledocholithiasis.   General surgery is consulted by emergency medicine physician Dr Shaune Pollack, MD for evaluation and management of acute cholecystitis.   PAST MEDICAL HISTORY (PMH):  History reviewed. No pertinent past medical history.  Reviewed. Otherwise negative.   PAST SURGICAL HISTORY (PSH):  Past Surgical History:  Procedure Laterality Date  . TUBAL LIGATION      Reviewed. Otherwise negative.   MEDICATIONS:  Prior to Admission medications   Medication Sig Start Date End Date Taking? Authorizing Provider  cyclobenzaprine (FLEXERIL) 10 MG tablet Take 10 mg by mouth 2 (two) times daily as needed. 04/02/20  Yes [provider]  norethindrone (MICRONOR) 0.35 MG tablet Take 1 tablet by mouth daily. 01/11/20  Yes [provider]  omeprazole (PRILOSEC)  20 MG capsule Take 20 mg by mouth daily. 01/11/20  Yes [provider]     ALLERGIES:  No Known Allergies   SOCIAL HISTORY:  Social History   Socioeconomic History  . Marital status: Single    Spouse name: Not on file  . Number of children: Not on file  . Years of education: Not on file  . Highest education level: Not on file  Occupational History  . Not on file  Tobacco Use  . Smoking status: Never Smoker  . Smokeless tobacco: Never Used  Substance and Sexual Activity  . Alcohol use: Never  . Drug use: Never  . Sexual activity: Not on file  Other Topics Concern  . Not on file  Social History Narrative  . Not on file   Social Determinants of Health   Financial Resource Strain:   . Difficulty of Paying Living Expenses: Not on file  Food Insecurity:   . Worried About Programme researcher, broadcasting/film/video in the Last Year: Not on file  . Ran Out of Food in the Last Year: Not on file  Transportation Needs:   . Lack of Transportation (Medical): Not on file  . Lack of Transportation (Non-Medical): Not on file  Physical Activity:   . Days of Exercise per Week: Not on file  . Minutes of Exercise per Session: Not on file  Stress:   . Feeling of Stress : Not on file  Social Connections:   . Frequency of Communication with Friends and Family: Not on file  . Frequency of Social Gatherings with Friends and Family: Not on file  . Attends Religious Services: Not on file  . Active Member of  Clubs or Organizations: Not on file  . Attends Banker Meetings: Not on file  . Marital Status: Not on file  Intimate Partner Violence:   . Fear of Current or Ex-Partner: Not on file  . Emotionally Abused: Not on file  . Physically Abused: Not on file  . Sexually Abused: Not on file     FAMILY HISTORY:  No family history on file.  Otherwise negative.   REVIEW OF SYSTEMS:  Review of Systems  Constitutional: Negative for chills and fever.  HENT: Negative for congestion and sore  throat.   Respiratory: Negative for cough and shortness of breath.   Cardiovascular: Negative for chest pain and palpitations.  Gastrointestinal: Positive for abdominal pain, nausea and vomiting. Negative for blood in stool, constipation and diarrhea.  Genitourinary: Negative for dysuria and urgency.  All other systems reviewed and are negative.   VITAL SIGNS:  Temp:  [98 F (36.7 C)-98.8 F (37.1 C)] 98.7 F (37.1 C) (10/13 0409) Pulse Rate:  [59-80] 80 (10/13 0737) Resp:  [15-19] 17 (10/13 0737) BP: (121-135)/(70-88) 123/88 (10/13 0737) SpO2:  [94 %-100 %] 98 % (10/13 0737) Weight:  [77.1 kg] 77.1 kg (10/12 1801)     Height: 4\' 11"  (149.9 cm) Weight: 77.1 kg BMI (Calculated): 34.32   PHYSICAL EXAM:  Physical Exam Vitals and nursing note reviewed. Exam conducted with a chaperone present.  Constitutional:      General: She is not in acute distress.    Appearance: She is well-developed. She is obese. She is not ill-appearing.  HENT:     Head: Normocephalic and atraumatic.     Mouth/Throat:     Mouth: Mucous membranes are moist.     Pharynx: Oropharynx is clear.  Eyes:     General: No scleral icterus.    Extraocular Movements: Extraocular movements intact.  Cardiovascular:     Rate and Rhythm: Normal rate and regular rhythm.     Heart sounds: Normal heart sounds. No murmur heard.   Pulmonary:     Effort: Pulmonary effort is normal. No respiratory distress.     Breath sounds: Normal breath sounds. No wheezing.  Abdominal:     General: A surgical scar is present. There is no distension.     Palpations: Abdomen is soft.     Tenderness: There is abdominal tenderness in the right upper quadrant and epigastric area. There is no guarding or rebound.  Genitourinary:    Comments: Deferred Skin:    General: Skin is warm and dry.     Coloration: Skin is not jaundiced or pale.  Neurological:     General: No focal deficit present.     Mental Status: She is alert and oriented to  person, place, and time.  Psychiatric:        Mood and Affect: Mood normal.        Behavior: Behavior normal.     INTAKE/OUTPUT:  This shift: No intake/output data recorded.  Last 2 shifts: @IOLAST2SHIFTS @  Labs:  CBC Latest Ref Rng & Units 04/03/2020 04/02/2020 10/14/2014  WBC 4.0 - 10.5 K/uL 13.5(H) 15.9(H) 8.9  Hemoglobin 12.0 - 15.0 g/dL 06/02/2020 10/16/2014 28.4  Hematocrit 36 - 46 % 36.2 39.4 40.7  Platelets 150 - 400 K/uL 208 245 237   CMP Latest Ref Rng & Units 04/03/2020 04/02/2020 10/14/2014  Glucose 70 - 99 mg/dL 06/02/2020) 10/16/2014) 93  BUN 6 - 20 mg/dL 11 16 16   Creatinine 0.44 - 1.00 mg/dL 102(V 253(G  Sodium  135 - 145 mmol/L 136 135 141  Potassium 3.5 - 5.1 mmol/L 3.6 3.8 3.8  Chloride 98 - 111 mmol/L 102 101 105  CO2 22 - 32 mmol/L 27 25 28   Calcium 8.9 - 10.3 mg/dL ) 9.3 9.5  Total Protein 6.5 - 8.1 g/dL 7.0 8.0 7.8  Total Bilirubin 0.3 - 1.2 mg/dL 3.0(H) 1.6(H) 0.6  Alkaline Phos 38 - 126 U/L 108 85 65  AST 15 - 41 U/L 382(H) 135(H) 54(H)  ALT 0 - 44 U/L 338(H) 84(H) 61(H)     Imaging studies:   RUQ 7.6(H (04/02/2020) personally reviewed with cholelithiasis in neck of GB and wall thickening, no appreciable CBD defects, and radiologist report reviewed:  IMPRESSION: 1. Non-mobile 2 cm stone in the neck of the gallbladder with gallbladder wall thickening and a positive sonographic Murphy sign. Findings are compatible with cholelithiasis and acute cholecystitis in the appropriate clinical setting. 2. Borderline dilatation of the common bile duct. No intrahepatic biliary ductal dilatation is seen. 3. Increased hepatic echogenicity, most often compatible with hepatic steatosis.  MRCP (04/03/2020) personally reviewed without evidence of choledocholithiasis, and radiologist report reviewed:  IMPRESSION: 1. Findings of acute cholecystitis in the setting of cholelithiasis with large gallstone at the neck of the gallbladder measuring approximately 1.9 cm. 2. Common bile  duct while dilated to approximately 7 mm shows no filling defect. This is of uncertain significance. 3. Mild hepatic steatosis with areas of fatty sparing.    Assessment/Plan: (ICD-10's: K81.0) 34 y.o. female with upper abdominal pain found to have cholelithiasis and likely cholecystitis.    - Admit to general surgery  - Will plan on robotic assisted laparoscopic cholecystectomy with Dr 31 this afternoon pending OR/Anesthesia availability   - All risks, benefits, and alternatives to above procedure(s) were discussed with the patient, all of her questions were answered to her expressed satisfaction, patient expresses *she wishes to proceed, and informed consent was obtained.  - NPO + IVF Resuscitation  - IV Abx (Zosyn)  - Monitor abdominal examination; on-going bowel function  - Pain control prn; antiemetics prn   - DVT prophylaxis; hold for OR  All of the above findings and recommendations were discussed with the patient, and all of her questions were answered to her expressed satisfaction.  -- Aleen Campi, PA-C [facility] 04/03/2020, 8:02 AM [phone] M-F: 7am - 4pm

## 2020-04-03 NOTE — Op Note (Signed)
Procedure Date:  04/03/2020  Pre-operative Diagnosis:  Acute cholecystitis  Post-operative Diagnosis:  Acute cholecystitis  Procedure:  Robotic assisted cholecystectomy with ICG FireFly cholangiogram  Surgeon:  Howie Ill, MD  Anesthesia:  General endotracheal  Estimated Blood Loss:  100 ml  Specimens:  gallbladder  Complications:  Small liver injury, which was cauterized.  Indications for Procedure:  This is a 34 y.o. female who presents with abdominal pain and workup revealing acute cholecystitis.  The benefits, complications, treatment options, and expected outcomes were discussed with the patient. The risks of bleeding, infection, recurrence of symptoms, failure to resolve symptoms, bile duct damage, bile duct leak, retained common bile duct stone, bowel injury, and need for further procedures were all discussed with the patient and she was willing to proceed.  Description of Procedure: The patient was correctly identified in the preoperative area and brought into the operating room.  The patient was placed supine with VTE prophylaxis in place.  Appropriate time-outs were performed.  Anesthesia was induced and the patient was intubated.  Appropriate antibiotics were infused.  The abdomen was prepped and draped in a sterile fashion. An infraumbilical incision was made. A cutdown technique was used to enter the abdominal cavity without injury, and a 12 mm trocar was inserted.  Pneumoperitoneum was obtained with appropriate opening pressures.  Three 8 mm robotic ports were placed in the mid abdomen at the level of the umbilicus under direct visualization.  The DaVinci platform was docked, camera targeted, and instruments placed under direct visualization.  The gallbladder was identified.  There was omentum overlying the gallbladder with a lot of edema.  The gallbladder itself was very edematous and distended.  This required laparoscopic needle decompression first.  The fundus was  grasped and retracted cephalad.  Adhesions were lysed bluntly and with electrocautery. The infundibulum was grasped and retracted laterally, exposing the peritoneum overlying the gallbladder.  This was incised with electrocautery and extended on either side of the gallbladder.  A Firefly cholangiogram was used to identify the cystic duct and the common bile duct.  The cystic duct and anterior and posterior branches of the cystic artery were clearly identified and bluntly dissected.  All three were clipped twice proximally and once distally, cutting in between.  The gallbladder was taken from the gallbladder fossa in a retrograde fashion with electrocautery. There was some oozing from the liver bed.  The gallbladder was placed in an Endocatch bag. The liver bed was inspected and any bleeding was controlled with electrocautery. The right upper quadrant was then inspected again revealing intact clips, no bleeding, and no ductal injury.  The area was thoroughly irrigated.  In elevating the liver for irrigation, the tip of the suction-irrigator instrument punctured the inferior portion of the liver.  This created bleeding, which was controlled with cautery.  The area was thoroughly irrigated again and hemostasis was good.    The DaVinci platform was then undocked and instruments removed.  The 8 mm ports were removed under direct visualization and the 12 mm trocar was removed.  The Endocatch bag was brought out via the umbilical incision. The fascial opening was closed using 0 vicryl suture.  Local anesthetic was infused in all incisions and the incisions were closed with 3-0 Vicryl and 4-0 Monocryl.  The wounds were cleaned and sealed with DermaBond.  The patient was emerged from anesthesia and extubated and brought to the recovery room for further management.  The patient tolerated the procedure well and all counts were  correct at the end of the case.   Howie Ill, MD

## 2020-04-04 LAB — COMPREHENSIVE METABOLIC PANEL
ALT: 262 U/L — ABNORMAL HIGH (ref 0–44)
AST: 148 U/L — ABNORMAL HIGH (ref 15–41)
Albumin: 3.5 g/dL (ref 3.5–5.0)
Alkaline Phosphatase: 101 U/L (ref 38–126)
Anion gap: 8 (ref 5–15)
BUN: 8 mg/dL (ref 6–20)
CO2: 27 mmol/L (ref 22–32)
Calcium: 8.6 mg/dL — ABNORMAL LOW (ref 8.9–10.3)
Chloride: 99 mmol/L (ref 98–111)
Creatinine, Ser: 0.77 mg/dL (ref 0.44–1.00)
GFR, Estimated: >60 mL/min (ref >60)
Glucose, Bld: 146 mg/dL — ABNORMAL HIGH (ref 70–99)
Potassium: 3.5 mmol/L (ref 3.5–5.1)
Sodium: 134 mmol/L — ABNORMAL LOW (ref 135–145)
Total Bilirubin: 1.5 mg/dL — ABNORMAL HIGH (ref 0.3–1.2)
Total Protein: 6.9 g/dL (ref 6.5–8.1)

## 2020-04-04 LAB — CBC
HCT: 34.5 % — ABNORMAL LOW (ref 36.0–46.0)
Hemoglobin: 11.5 g/dL — ABNORMAL LOW (ref 12.0–15.0)
MCH: 29.3 pg (ref 26.0–34.0)
MCHC: 33.3 g/dL (ref 30.0–36.0)
MCV: 88 fL (ref 80.0–100.0)
Platelets: 213 10*3/uL (ref 150–400)
RBC: 3.92 MIL/uL (ref 3.87–5.11)
RDW: 12.8 % (ref 11.5–15.5)
WBC: 15.9 10*3/uL — ABNORMAL HIGH (ref 4.0–10.5)
nRBC: 0 % (ref 0.0–0.2)

## 2020-04-04 MED ORDER — DIPHENHYDRAMINE HCL 25 MG PO CAPS
25.0000 mg | ORAL_CAPSULE | Freq: Four times a day (QID) | ORAL | Status: DC | PRN
  Administered 2020-04-04: 25 mg via ORAL
  Filled 2020-04-04: qty 1

## 2020-04-04 NOTE — Progress Notes (Signed)
[facility] SURGICAL PROGRESS NOTE  Hospital Day(s): 2.   Post op day(s): 1 Day Post-Op.   Interval History:  Patient seen and examined no acute events or new complaints overnight.  Patient reports she has soreness in her RUQ primarily with deep inspiration and movements No fever, chills, nausea, emesis She did have a slight bump in her leukocytosis to 15.9K Renal function normal, sCr - 0.77, UO - 500 ccs Hyperbilirubinemia improved, now 1.5 No significant electrolyte derangements On CLD, tolerating well  Vital signs in last 24 hours: [min-max] current  Temp:  [97.7 F (36.5 C)-98.7 F (37.1 C)] 97.8 F (36.6 C) (10/14 0520) Pulse Rate:  [67-104] 67 (10/14 0520) Resp:  [16-27] 16 (10/14 0520) BP: (90-152)/(50-94) 90/50 (10/14 0520) SpO2:  [92 %-99 %] 96 % (10/14 0520)     Height: 4\' 11"  (149.9 cm) Weight: 77.1 kg BMI (Calculated): 34.32   Intake/Output last 2 shifts:  10/13 0701 - 10/14 0700 In: 1500 [I.V.:1500] Out: 500 [Urine:500]   Physical Exam:  Constitutional: alert, cooperative and no distress  Respiratory: breathing non-labored at rest  Cardiovascular: regular rate and sinus rhythm  Gastrointestinal: Soft, incisional soreness, and non-distended, no rebound/guarding Integumentary: Laparoscopic incisions are CDI with dermabond, no erythema or drainage  Labs:  CBC Latest Ref Rng & Units 04/04/2020 04/03/2020 04/02/2020  WBC 4.0 - 10.5 K/uL 15.9(H) 13.5(H) 15.9(H)  Hemoglobin 12.0 - 15.0 g/dL 11.5(L) 12.3 13.3  Hematocrit 36 - 46 % 34.5(L) 36.2 39.4  Platelets 150 - 400 K/uL 213 208 245   CMP Latest Ref Rng & Units 04/04/2020 04/03/2020 04/02/2020  Glucose 70 - 99 mg/dL 06/02/2020) 696(E) 952(W)  BUN 6 - 20 mg/dL 8 11 16   Creatinine 0.44 - 1.00 mg/dL 413(K 4.40  Sodium 135 - 145 mmol/L 134(L) 136 135  Potassium 3.5 - 5.1 mmol/L 3.5 3.6 3.8  Chloride 98 - 111 mmol/L 99 102 101  CO2 22 - 32 mmol/L 27 27 25   Calcium 8.9 - 10.3 mg/dL 1.02)  7.25) 9.3  Total Protein 6.5 - 8.1 g/dL 6.9 7.0 8.0  Total Bilirubin 0.3 - 1.2 mg/dL ) 3.0(H) 1.6(H)  Alkaline Phos 38 - 126 U/L 101 108 85  AST 15 - 41 U/L 148(H) 382(H) 135(H)  ALT 0 - 44 U/L 262(H) 338(H) 84(H)     Imaging studies: No new pertinent imaging studies   Assessment/Plan:  33 y.o. female with increase in leukocytosis, which may be reactive from surgery, otherwise doing well clinically 1 Day Post-Op s/p robotic assisted laparoscopic choelcystectomy for cholecystitis   - Okay to advance diet as tolerates   - Wean from IVF  - Continue ABx (Zosyn)   - Monitor abdominal examination  - Pain control prn; antiemetics prn   - Monitor leukocytosis  - Mobilization encouraged    - Discharge Planning: If tolerates advancement of diet and doing well, she can potentially discharge this afternoon vs tomorrow morning.    All of the above findings and recommendations were discussed with the patient, and the medical team, and all of patient's questions were answered to her expressed satisfaction.  -- 4.4(I, PA-C [facility] 04/04/2020, 8:45 AM [phone] M-F: 7am - 4pm

## 2020-04-04 NOTE — Progress Notes (Signed)
Patient complains of "itching" after receiving Oxycodone. Yves Dill, PA and Dr. Aleen Campi notified via secure chat; new order written for Benadryl 25 mg as needed for itching. Windy Carina, RN 7:54 PM 04/04/2020

## 2020-04-04 NOTE — Anesthesia Postprocedure Evaluation (Signed)
Anesthesia Post Note  Patient: Carol Wilkerson  Procedure(s) Performed: XI ROBOTIC ASSISTED LAPAROSCOPIC CHOLECYSTECTOMY (N/A )  Patient location during evaluation: PACU Anesthesia Type: General Level of consciousness: awake and alert Pain management: pain level controlled Vital Signs Assessment: post-procedure vital signs reviewed and stable Respiratory status: spontaneous breathing, nonlabored ventilation, respiratory function stable and patient connected to nasal cannula oxygen Cardiovascular status: blood pressure returned to baseline and stable Postop Assessment: no apparent nausea or vomiting Anesthetic complications: no   No complications documented.   Last Vitals:  Vitals:   04/03/20 2151 04/04/20 0046  BP: 107/65 (!) 102/51  Pulse: 79 71  Resp: 18 20  Temp: 36.5 C 36.7 C  SpO2: 97% 96%    Last Pain:  Vitals:   04/04/20 0046  TempSrc: Oral  PainSc:                  Cleda Mccreedy Shown Dissinger

## 2020-04-05 ENCOUNTER — Other Ambulatory Visit: Payer: Self-pay | Admitting: Physician Assistant

## 2020-04-05 LAB — CBC
HCT: 32.7 % — ABNORMAL LOW (ref 36.0–46.0)
Hemoglobin: 10.7 g/dL — ABNORMAL LOW (ref 12.0–15.0)
MCH: 29.1 pg (ref 26.0–34.0)
MCHC: 32.7 g/dL (ref 30.0–36.0)
MCV: 88.9 fL (ref 80.0–100.0)
Platelets: 215 10*3/uL (ref 150–400)
RBC: 3.68 MIL/uL — ABNORMAL LOW (ref 3.87–5.11)
RDW: 12.8 % (ref 11.5–15.5)
WBC: 11.3 10*3/uL — ABNORMAL HIGH (ref 4.0–10.5)
nRBC: 0 % (ref 0.0–0.2)

## 2020-04-05 LAB — SURGICAL PATHOLOGY

## 2020-04-05 MED ORDER — AMOXICILLIN-POT CLAVULANATE 875-125 MG PO TABS: 1.0000 | ORAL_TABLET | Freq: Two times a day (BID) | ORAL | 0 refills | Status: DC

## 2020-04-05 MED ORDER — IBUPROFEN 800 MG PO TABS: 800.0000 mg | ORAL_TABLET | Freq: Three times a day (TID) | ORAL | 0 refills | Status: AC | PRN

## 2020-04-05 MED ORDER — OXYCODONE HCL 5 MG PO TABS
5.0000 mg | ORAL_TABLET | Freq: Four times a day (QID) | ORAL | 0 refills | Status: DC | PRN
Start: 2020-04-05 — End: 2020-04-05

## 2020-04-05 MED ORDER — IBUPROFEN 800 MG PO TABS: 800.0000 mg | ORAL_TABLET | Freq: Three times a day (TID) | ORAL | 0 refills | Status: DC | PRN

## 2020-04-05 MED ORDER — AMOXICILLIN-POT CLAVULANATE 875-125 MG PO TABS: 1.0000 | ORAL_TABLET | Freq: Two times a day (BID) | ORAL | 0 refills | Status: AC

## 2020-04-05 MED ORDER — OXYCODONE HCL 5 MG PO TABS
5.0000 mg | ORAL_TABLET | Freq: Four times a day (QID) | ORAL | 0 refills | Status: DC | PRN
Start: 2020-04-05 — End: 2020-04-17

## 2020-04-05 NOTE — Plan of Care (Signed)

## 2020-04-05 NOTE — Discharge Instructions (Signed)
In addition to included general post-operative instructions for laparoscopic cholecystectomy,  Diet: Resume home diet. Recommend avoiding fatty/greasy foods for the next few days while your body adjusts to not having a gallbladder. If you do eat these, you may notice diarrhea.    Activity: No heavy lifting >20 pounds (children, pets, laundry, garbage) for 4 weeks, but light activity and walking are encouraged. Do not drive or drink alcohol if taking narcotic pain medications or having pain that might distract from driving.  Wound care: You may shower/get incision wet with soapy water and pat dry (do not rub incisions), but no baths or submerging incision underwater until follow-up.   Medications: Resume all home medications. For mild to moderate pain: acetaminophen (Tylenol) or ibuprofen/naproxen (if no kidney disease). Combining Tylenol with alcohol can substantially increase your risk of causing liver disease. Narcotic pain medications, if prescribed, can be used for severe pain, though may cause nausea, constipation, and drowsiness. Do not combine Tylenol and Percocet (or similar) within a 6 hour period as Percocet (and similar) contain(s) Tylenol. If you do not need the narcotic pain medication, you do not need to fill the prescription.  Call office [phone] / [phone]) at any time if any questions, worsening pain, fevers/chills, bleeding, drainage from incision site, or other concerns.

## 2020-04-05 NOTE — Discharge Summary (Signed)
[facility] SUR[facility]ICAL DISCHARGE SUMMARY  Patient ID: Carol Wilkerson MRN: [id number] DOB/AGE: [birth year] 33 y.o.  Admit date: 04/02/2020 Discharge date: 04/05/2020  Discharge Diagnoses Patient Active Problem List   Diagnosis Date Noted   Acute cholecystitis 04/02/2020    Consultants None  Procedures 04/03/2020:  Robotic Assisted Laparoscopic Cholecystectomy   HPI: 34 y.o. female presented to [facility] ED yesterday (10/12) for abdominal pain. Patient reports she had the acute onset of upper abdominal pain on Friday night after eating fried chicken. This pain has been in her epigastric and RUQ since the onset. This is a sharp pain and constant. She has associated nausea and emesis with this pain. No fever, chills, cough, CP, SOB, urinary changes, or bowel changes. She actually reports presenting to [facility] for this on Sunday night but was reportedly only treated for musculoskeletal pains. No history of similar pain in the past. Only previous abdominal surgery is tubal ligation. Initial laboratory work up revealed leukocytosis to 15.9k (which is improved to 13.5K this morning), renal function was normal, she did have hyperbilirubinemia to 1.6 which is now slightly worse at 3.0 which is primarily indirect. RUQ [place] was concerning for cholelithiasis without gross evidence of choledocholithiasis. She did ultimately have MRCP given increase in hyperbilirubinemia which was negative for choledocholithiasis.   Hospital Course: Informed consent was obtained and documented, and patient underwent uneventful robotic assisted laparoscopic cholecystectomy (Dr Leland Johns, 04/03/2020).  Post-operatively, patient had mild leukocytosis but otherwise her pain/symptoms improved/resolved and advancement of patient's diet and ambulation were well-tolerated. The remainder of patient's hospital course was essentially unremarkable, and discharge planning was initiated accordingly with patient  safely able to be discharged home with appropriate discharge instructions, antibiotics (Augmentin x7 day for 10 days total), pain control, and outpatient follow-up after all of her questions were answered to her expressed satisfaction.   Discharge Condition: Good   Physical Examination:  Constitutional: alert, cooperative and no distress  Respiratory: breathing non-labored at rest  Cardiovascular: regular rate and sinus rhythm  Gastrointestinal: Soft, incisional soreness, and non-distended, no rebound/guarding Integumentary: Laparoscopic incisions are CDI with dermabond, no erythema or drainage   Allergies as of 04/05/2020   No Known Allergies     Medication List    STOP taking these medications   cyclobenzaprine 10 MG tablet Commonly known as: FLEXERIL     TAKE these medications   amoxicillin-clavulanate 875-125 MG tablet Commonly known as: Augmentin Take 1 tablet by mouth 2 (two) times daily for 7 days.   ibuprofen 800 MG tablet Commonly known as: ADVIL Take 1 tablet (800 mg total) by mouth every 8 (eight) hours as needed.   norethindrone 0.35 MG tablet Commonly known as: MICRONOR Take 1 tablet by mouth daily.   omeprazole 20 MG capsule Commonly known as: PRILOSEC Take 20 mg by mouth daily.   oxyCODONE 5 MG immediate release tablet Commonly known as: Oxy IR/ROXICODONE Take 1 tablet (5 mg total) by mouth every 6 (six) hours as needed for severe pain or breakthrough pain.         Follow-up Information    Piscoya, Elita Quick, MD. Schedule an appointment as soon as possible for a visit in 2 week(s).   Specialty: General Surgery Why: s/p robotic assisted laparoscopic cholecystectomy Contact information: [address] [place] [address] [phone]                Time spent on discharge management including discussion of hospital course, clinical condition, outpatient instructions, prescriptions, and  follow up with the patient and members  of the medical team: >30 minutes  -- Lynden Oxford , PA-C [facility]  04/05/2020, 10:09 AM [phone] M-F: 7am - 4pm

## 2020-04-17 ENCOUNTER — Encounter: Payer: Self-pay | Admitting: Surgery

## 2020-04-17 ENCOUNTER — Ambulatory Visit (INDEPENDENT_AMBULATORY_CARE_PROVIDER_SITE_OTHER): Payer: Medicaid Other | Admitting: Surgery

## 2020-04-17 ENCOUNTER — Other Ambulatory Visit: Payer: Self-pay

## 2020-04-17 VITALS — BP 103/70 | HR 60 | Temp 98.0°F | Resp 12 | Ht 59.0 in | Wt 167.0 lb

## 2020-04-17 DIAGNOSIS — K810 Acute cholecystitis: Secondary | ICD-10-CM

## 2020-04-17 NOTE — Patient Instructions (Signed)

## 2020-04-17 NOTE — Progress Notes (Signed)
04/17/2020  HPI: Carol Wilkerson is a 34 y.o. female s/p robotic assisted cholecystectomy on 04/03/20.  She had a very distended and edematous gallbladder and intraop had a small liver puncture with the suction/irrigator which required cauterization.  Patient today reports that she's been doing well.  She no longer has the bloatedness and pain that she was having after eating before surgery.  She's been avoiding greasy foods.  She's been tolerating a diet otherwise, with normal bowel function.  Vital signs: BP 103/70   Pulse 60   Temp 98 F (36.7 C) (Oral)   Resp 12   Ht 4\' 11"  (1.499 m)   Wt 167 lb (75.8 kg)   SpO2 99%   BMI 33.73 kg/m    Physical Exam: Constitutional:  No acute distress Abdomen:  Soft, non-distended, non-tender to palpation.  Incisions are clean, dry, intact, with dermabond in place.  No evidence of infection.  Assessment/Plan: This is a 34 y.o. female s/p robotic assisted cholecystectomy  --Patient is recovering well.  Discussed that she really does not have any dietary restrictions after surgery.  Some patients do need about 2-3 weeks for their bodies to adjust to not having a gallbladder, but otherwise no dietary restrictions. --Still has two weeks of no heavy lifting/pushing of no more than 10-15 lbs. --Follow up as needed.   31, MD [facility]

## 2020-04-23 ENCOUNTER — Telehealth: Payer: Self-pay | Admitting: Surgery

## 2020-04-23 NOTE — Telephone Encounter (Signed)
Called patient and she states she has minimal clear liquid coming out. States its minimal that she does not have to use a gauze or bandaide on it. Denies pain, fevers, chills, denies odor to area or redness. Advised patient if its minimal drainage no need to alert ourselves. Advised to place a bandaide on area and use ice packs to help with the area. Pt advised to call back if she develops fevers, more drainage, odor redness to the area. Pt is agreeable to this .

## 2020-04-23 NOTE — Telephone Encounter (Signed)
Patient is calling and said her incision site looks like it has some yellowish drainage coming out. Please call patient and advise.

## 2020-07-16 ENCOUNTER — Other Ambulatory Visit: Payer: Self-pay

## 2020-07-16 ENCOUNTER — Emergency Department: Payer: Medicaid Other

## 2020-07-16 ENCOUNTER — Emergency Department
Admission: EM | Admit: 2020-07-16 | Discharge: 2020-07-17 | Disposition: A | Discharge status: Home / Self Care | Payer: Medicaid Other | Attending: Emergency Medicine | Admitting: Emergency Medicine

## 2020-07-16 DIAGNOSIS — Z8616 Personal history of COVID-19: Secondary | ICD-10-CM | POA: Diagnosis not present

## 2020-07-16 DIAGNOSIS — R202 Paresthesia of skin: Secondary | ICD-10-CM | POA: Diagnosis present

## 2020-07-16 LAB — BASIC METABOLIC PANEL
Anion gap: 10 (ref 5–15)
BUN: 10 mg/dL (ref 6–20)
CO2: 26 mmol/L (ref 22–32)
Calcium: 9.5 mg/dL (ref 8.9–10.3)
Chloride: 103 mmol/L (ref 98–111)
Creatinine, Ser: 0.77 mg/dL (ref 0.44–1.00)
GFR, Estimated: >60 mL/min (ref >60)
Glucose, Bld: 100 mg/dL — ABNORMAL HIGH (ref 70–99)
Potassium: 3.7 mmol/L (ref 3.5–5.1)
Sodium: 139 mmol/L (ref 135–145)

## 2020-07-16 LAB — URINALYSIS, COMPLETE (UACMP) WITH MICROSCOPIC
Bacteria, UA: NONE SEEN
Bilirubin Urine: NEGATIVE
Glucose, UA: NEGATIVE mg/dL
Hgb urine dipstick: NEGATIVE
Ketones, ur: NEGATIVE mg/dL
Leukocytes,Ua: NEGATIVE
Nitrite: NEGATIVE
Protein, ur: NEGATIVE mg/dL
Specific Gravity, Urine: 1.018 (ref 1.005–1.030)
pH: 8 (ref 5.0–8.0)

## 2020-07-16 LAB — CBC
HCT: 39.2 % (ref 36.0–46.0)
Hemoglobin: 13.2 g/dL (ref 12.0–15.0)
MCH: 29 pg (ref 26.0–34.0)
MCHC: 33.7 g/dL (ref 30.0–36.0)
MCV: 86.2 fL (ref 80.0–100.0)
Platelets: 231 10*3/uL (ref 150–400)
RBC: 4.55 MIL/uL (ref 3.87–5.11)
RDW: 12.4 % (ref 11.5–15.5)
WBC: 11.2 10*3/uL — ABNORMAL HIGH (ref 4.0–10.5)
nRBC: 0 % (ref 0.0–0.2)

## 2020-07-16 LAB — POC URINE PREG, ED: Preg Test, Ur: NEGATIVE

## 2020-07-16 MED ORDER — DEXAMETHASONE SODIUM PHOSPHATE 10 MG/ML IJ SOLN
10.0000 mg | Freq: Once | INTRAMUSCULAR | Status: DC
  Filled 2020-07-16: qty 1

## 2020-07-16 MED ORDER — DEXAMETHASONE SODIUM PHOSPHATE 10 MG/ML IJ SOLN
10.0000 mg | Freq: Once | INTRAMUSCULAR | Status: AC
  Administered 2020-07-16: 10 mg via INTRAVENOUS

## 2020-07-16 MED ORDER — GADOBUTROL 1 MMOL/ML IV SOLN
7.0000 mL | Freq: Once | INTRAVENOUS | Status: AC | PRN
  Administered 2020-07-16: 7 mL via INTRAVENOUS
  Filled 2020-07-16: qty 7.5

## 2020-07-16 NOTE — Discharge Instructions (Signed)
Your MRI does not show any condition of concern.   Please reschedule your appointment with the specialist who can further evaluate the underlying cause of your symptoms.  The injection given today will hopefully help over the next 24 hours.

## 2020-07-16 NOTE — ED Triage Notes (Signed)
Pt A&O, ambulatory. States last night began having a migraine and took her prescribed medicine. States around 3am started having pain in legs/feet, describes like a numbness. States feeling is bilaterally. Noticed when she woke up about 7am that R hand and L elbow to hand is numb, like that feeling of sleeping on it and blood flow returns to area. States constant through out day. Speech clear. Able to move all extremities. No neuro deficits noted.

## 2020-07-16 NOTE — ED Notes (Signed)
Pt states numbness to both hands and both legs. Pt states it has been on and off for a while and at times will have swelling to the extremities. Pt states the numbness can be so bad, she cannot even hold her cell phone.

## 2020-07-20 NOTE — ED Provider Notes (Signed)
[facility] Emergency Department Provider Note ____________________________________________   Event Date/Time   First MD Initiated Contact with Patient 07/16/20 2122     (approximate)  I have reviewed the triage vital signs and the nursing notes.   HISTORY  Chief Complaint Numbness  HPI Carol Wilkerson is a 35 y.o. female with history of intermittent paresthesias and other history as listed below presents to the emergency department for treatment and evaluation. She states that paresthesias have become persistent in the right hand, left forearm, and bilateral feet and legs. Symptoms started with a migraine. She took her medication as prescribed and laid down. About 6am, she awoke and thought she had "slept wrong," but paresthesias have not gone away. They have never lasted this long. She first noticed the sensation in her right hand, then left forearm, followed by bilateral lower extremities. She has spoken with PCP about this in the past, but she feels no one is taking her seriously and "just keeps giving me medicine that doesn't work."         History reviewed. No pertinent past medical history.  Patient Active Problem List   Diagnosis Date Noted  . Acute cholecystitis 04/02/2020  . Elevated LFTs 03/04/2019  . Obesity (BMI 30-39.9) 03/04/2019  . Pneumonia due to COVID-19 virus 03/04/2019    Past Surgical History:  Procedure Laterality Date  . CHOLECYSTECTOMY    . TUBAL LIGATION      Prior to Admission medications   Medication Sig Start Date End Date Taking? Authorizing Provider  ibuprofen (ADVIL) 800 MG tablet Take 1 tablet (800 mg total) by mouth every 8 (eight) hours as needed. 04/05/20   Donovan Kail, PA-C  omeprazole (PRILOSEC) 20 MG capsule Take 20 mg by mouth daily. 01/11/20   [provider]    Allergies Patient has no known allergies.  History reviewed. No pertinent family history.  Social History Social  History   Tobacco Use  . Smoking status: Never Smoker  . Smokeless tobacco: Never Used  Substance Use Topics  . Alcohol use: Never  . Drug use: Never    Review of Systems  Constitutional: No fever/chills Eyes: No visual changes. ENT: No sore throat. Cardiovascular: Denies chest pain. Respiratory: Denies shortness of breath. Gastrointestinal: No abdominal pain.  No nausea, no vomiting.  No diarrhea.  No constipation. Genitourinary: Negative for dysuria. Musculoskeletal: Negative for back pain. Skin: Negative for rash. Neurological: Negative for headaches, focal weakness or numbness. Positive for paresthesias. ____________________________________________   PHYSICAL EXAM:  VITAL SIGNS: ED Triage Vitals  Enc Vitals Group     BP 07/16/20 1755 122/86     Pulse Rate 07/16/20 1755 67     Resp 07/16/20 1755 18     Temp 07/16/20 1755 98.7 F (37.1 C)     Temp Source 07/16/20 1755 Oral     SpO2 07/16/20 1755 97 %     Weight 07/16/20 1756 167 lb (75.8 kg)     Height 07/16/20 1756 4\' 11"  (1.499 m)     Head Circumference --      Peak Flow --      Pain Score 07/16/20 1756 8     Pain Loc --      Pain Edu? --      Excl. in GC? --     Constitutional: Alert and oriented. Well appearing and in no acute distress. Eyes: Conjunctivae are normal. PERRL. EOMI. Head: Atraumatic. Nose: No congestion/rhinnorhea. Mouth/Throat: Mucous membranes are moist.  Oropharynx non-erythematous. Neck: No stridor.   Hematological/Lymphatic/Immunilogical: No cervical lymphadenopathy. Cardiovascular: Normal rate, regular rhythm. Grossly normal heart sounds.  Good peripheral circulation. Respiratory: Normal respiratory effort.  No retractions. Lungs CTAB. Gastrointestinal: Soft and nontender. No distention. No abdominal bruits. No CVA tenderness. Genitourinary:  Musculoskeletal: No lower extremity tenderness nor edema.  No joint effusions. Neurologic:  Normal speech and language. No gross focal  neurologic deficits are appreciated. No gait instability.  Skin:  Skin is warm, dry and intact. No rash noted. Psychiatric: Mood and affect are normal. Speech and behavior are normal.  ____________________________________________   LABS (all labs ordered are listed, but only abnormal results are displayed)  Labs Reviewed  CBC - Abnormal; Notable for the following components:      Result Value   WBC 11.2 (*)    All other components within normal limits  BASIC METABOLIC PANEL - Abnormal; Notable for the following components:   Glucose, Bld 100 (*)    All other components within normal limits  URINALYSIS, COMPLETE (UACMP) WITH MICROSCOPIC - Abnormal; Notable for the following components:   Color, Urine YELLOW (*)    APPearance CLEAR (*)    All other components within normal limits  POC URINE PREG, ED   ____________________________________________  EKG  Not indicated.  ____________________________________________  RADIOLOGY  ED MD interpretation:    MR brain with and without negative for acute concerns.  I, Kem Boroughs, personally viewed and evaluated these images (plain radiographs) as part of my medical decision making, as well as reviewing the written report by the radiologist.  Official radiology report(s): No results found.  ____________________________________________   PROCEDURES  Procedure(s) performed (including Critical Care):  Procedures  ____________________________________________   INITIAL IMPRESSION / ASSESSMENT AND PLAN     35 year old female presents to the emergency department for treatment and evaluation of paresthesias. See HPI.  Plan will be to review labs drawn while awaiting ER room assignment.  DIFFERENTIAL DIAGNOSIS  Migraine, MS, electrolyte imbalance  ED COURSE  Labs are overall reassuring.  Pregnancy test negative.  Due to age and symptoms progressing from intermittent to persistent plan will be to get an MRI to rule out  MS/lesions.  Patient aware and agrees with the plan.  MRI of the brain with and without contrast is reassuring.  She will be treated with an injection of Decadron.  She will be advised to follow-up with neurology.  She is to arrange that follow-up via her primary care provider.  She was advised to return to the emergency department for symptoms of change or worsen if she is unable to schedule an appointment.    ___________________________________________   FINAL CLINICAL IMPRESSION(S) / ED DIAGNOSES  Final diagnoses:  Paresthesia     ED Discharge Orders    None       Carol Wilkerson was evaluated in Emergency Department on 07/20/2020 for the symptoms described in the history of present illness. She was evaluated in the context of the global COVID-19 pandemic, which necessitated consideration that the patient might be at risk for infection with the SARS-CoV-2 virus that causes COVID-19. Institutional protocols and algorithms that pertain to the evaluation of patients at risk for COVID-19 are in a state of rapid change based on information released by regulatory bodies including the CDC and federal and state organizations. These policies and algorithms were followed during the patient's care in the ED.   Note:  This document was prepared using Conservation officer, historic buildings and may include  unintentional dictation errors.   Chinita Pester, FNP 07/20/20 1709    Sharyn Creamer, MD 07/26/20 [phone]

## 2022-01-21 IMAGING — MR MR HEAD WO/W CM
16 series · 48 of 48 positions shown · IV contrast (gadavist)
Comparison: None.

CLINICAL DATA: Initial evaluation for acute numbness and tingling.

EXAM:
MRI HEAD WITHOUT AND WITH CONTRAST
TECHNIQUE: Multiplanar, multiecho pulse sequences of the brain and surrounding
structures were obtained without and with intravenous contrast.
CONTRAST:  7mL GADAVIST GADOBUTROL 1 MMOL/ML IV SOLN

[Series 5: ax dwi_tracew · axial · 3.0mm · 0.60mm/px · z∈[-112,+38]mm · 3 of 48 slices shown (1 of 2)]
[im 1/48]
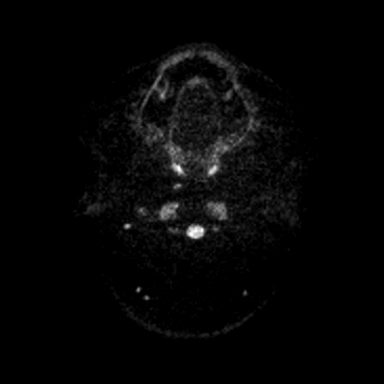
[im 24/48]
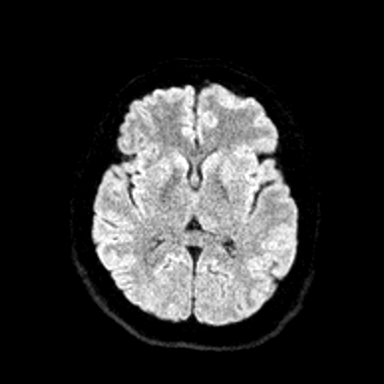
[im 48/48]
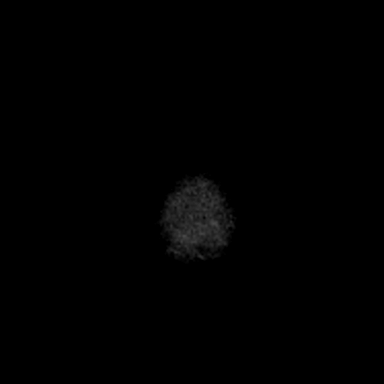

[Series 6: ax dwi_adc · axial · 3.0mm · 0.60mm/px · z∈[-112,+38]mm · 3 of 48 slices shown (1 of 2)]
[im 1/48]
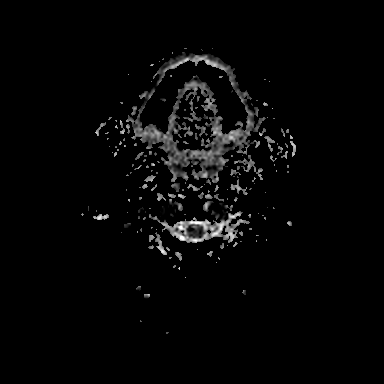
[im 24/48]
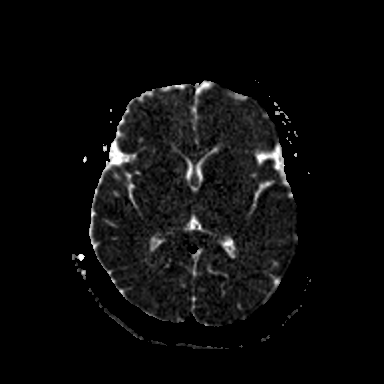
[im 48/48]
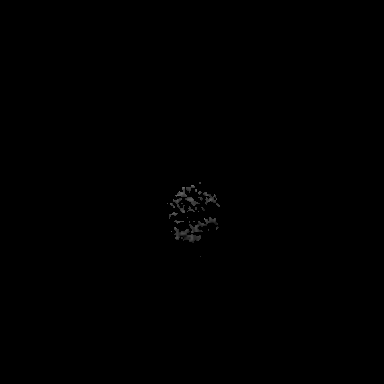

[Series 7: cor dwi_tracew · coronal · 5.0mm · 0.60mm/px · 2 of 36 slices shown]
[im 1/36]
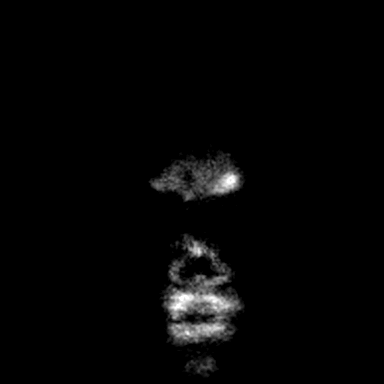
[im 36/36]
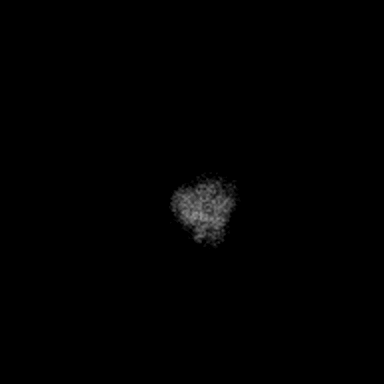

[Series 8: cor dwi_adc · coronal · 5.0mm · 0.60mm/px · 2 of 34 slices shown]
[im 1/34]
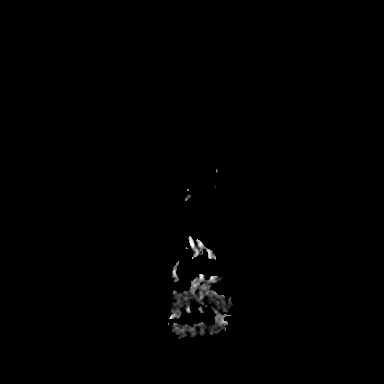
[im 34/34]
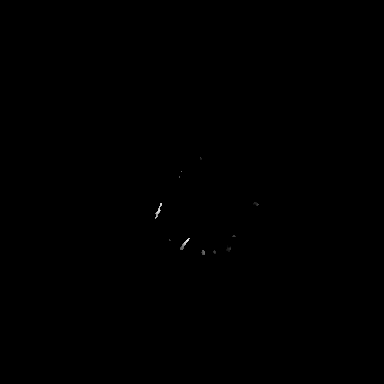

[Series 9: T1 · sagittal · 5.0mm · 0.62mm/px · 1 of 25 slices shown (1 of 2)]
[im 1/25]
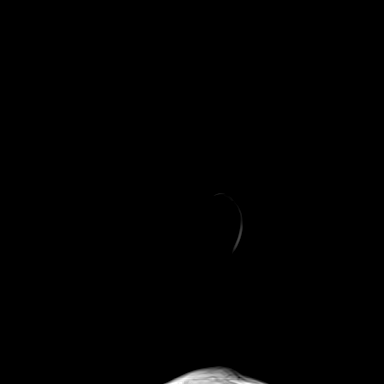

[Series 10: T2 · axial · 5.0mm · 0.53mm/px · 1 of 26 slices shown]
[im 1/26]
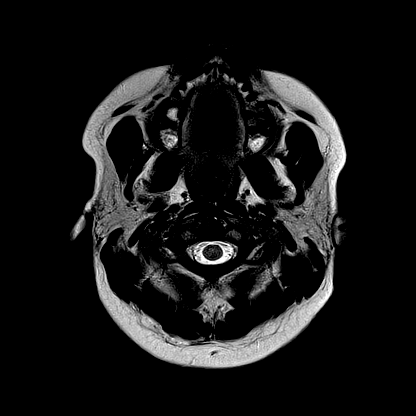

[Series 12: pha_images · axial · 3.0mm · 0.90mm/px · z∈[-123,+48]mm · 3 of 60 slices shown]
[im 1/60]
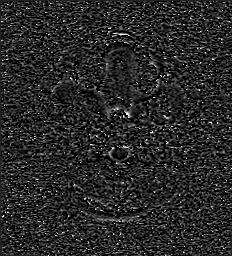
[im 30/60]
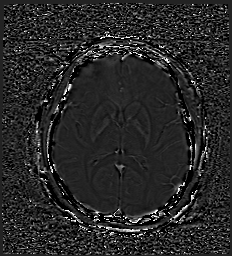
[im 60/60]
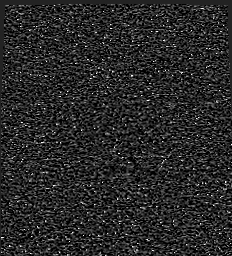

[Series 13: swi_images · axial · 3.0mm · 0.90mm/px · z∈[-123,+48]mm · 3 of 60 slices shown]
[im 1/60]
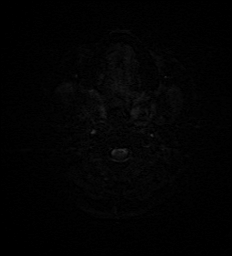
[im 30/60]
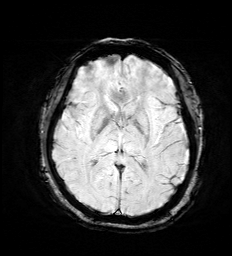
[im 60/60]
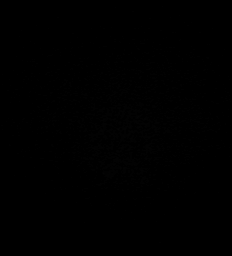

[Series 15: ax dwi_tracew · axial · 3.0mm · 1.31mm/px · z∈[-112,+38]mm · 3 of 48 slices shown (2 of 2)]
[im 1/48]
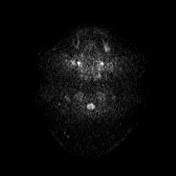
[im 24/48]
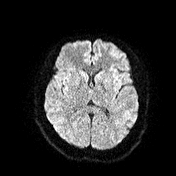
[im 48/48]
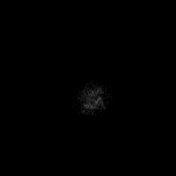

[Series 16: ax dwi_adc · axial · 3.0mm · 1.31mm/px · z∈[-112,+38]mm · 3 of 48 slices shown (2 of 2)]
[im 1/48]
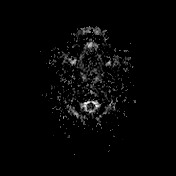
[im 24/48]
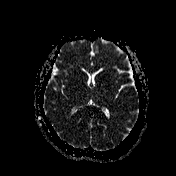
[im 48/48]
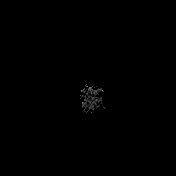

[Series 17: FLAIR · axial · 3.0mm · 0.53mm/px · z∈[-115,+42]mm · 3 of 55 slices shown]
[im 1/55]
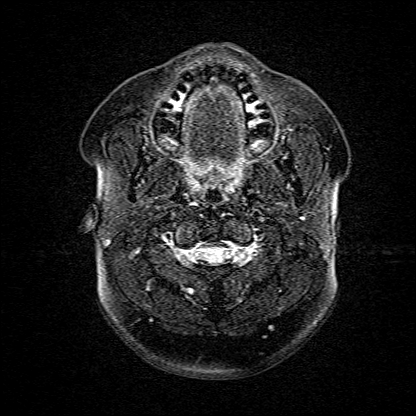
[im 28/55]
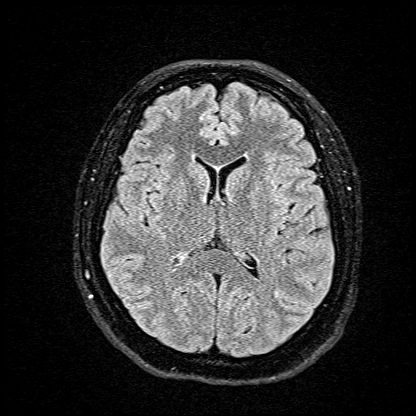
[im 55/55]
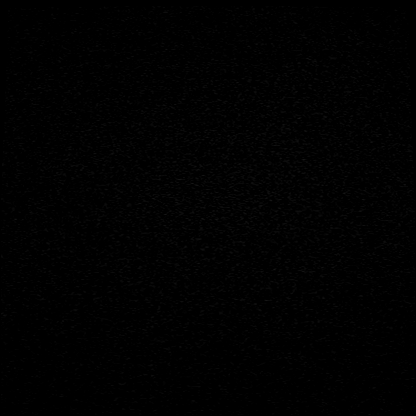

[Series 18: T1 · axial · 1.0mm · 0.98mm/px · z∈[-125,+45]mm · 9 of 174 slices shown (2 of 2)]
[im 1/174]
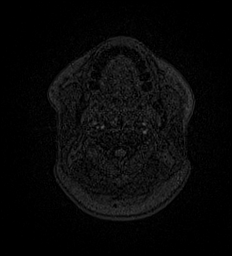
[im 22/174]
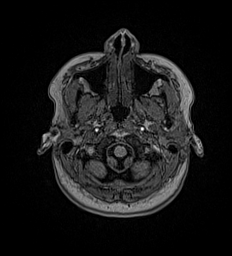
[im 44/174]
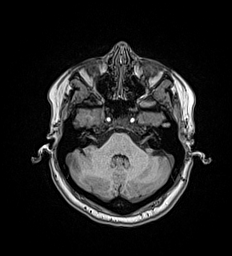
[im 65/174]
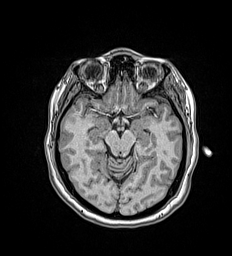
[im 87/174]
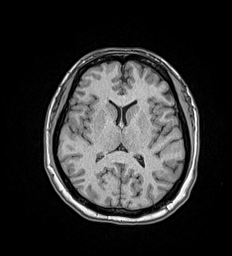
[im 109/174]
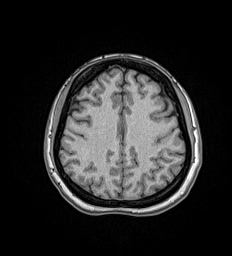
[im 130/174]
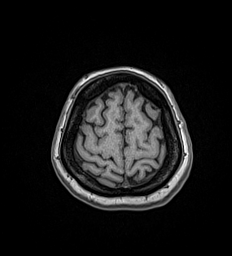
[im 152/174]
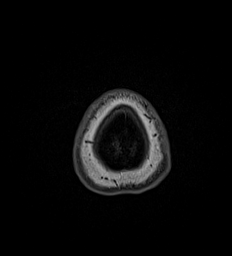
[im 174/174]
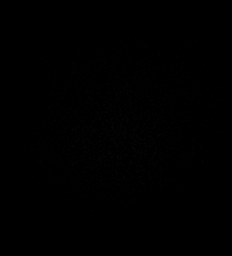

[Series 19: T2 post-contrast · coronal · 5.0mm · 0.57mm/px · 1 of 27 slices shown]
[im 1/27]
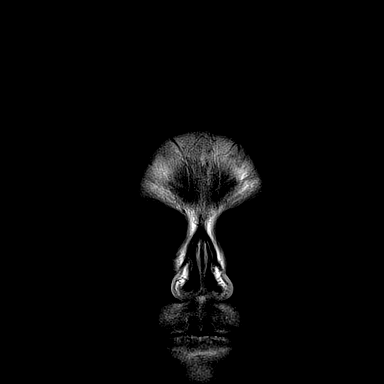

[Series 20: T1 post-contrast · axial · 1.0mm · 0.98mm/px · z∈[-125,+45]mm · 9 of 175 slices shown (1 of 3)]
[im 1/175]
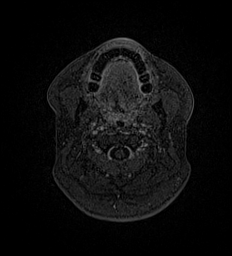
[im 22/175]
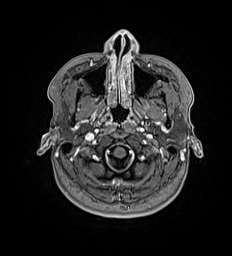
[im 44/175]
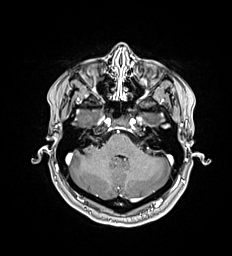
[im 66/175]
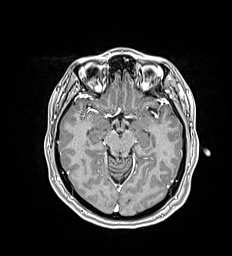
[im 88/175]
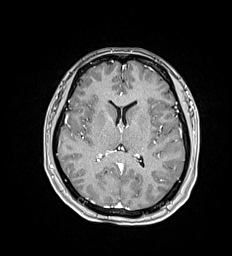
[im 109/175]
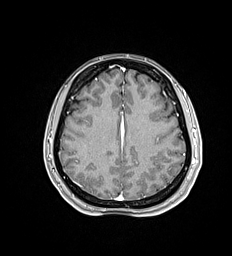
[im 131/175]
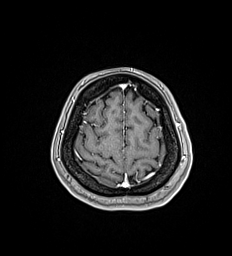
[im 153/175]
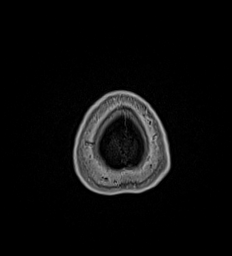
[im 175/175]
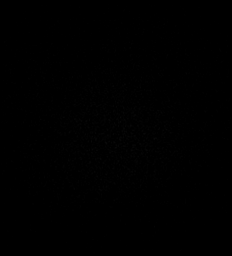

[Series 21: T1 post-contrast · coronal · 5.0mm · 0.57mm/px · 1 of 27 slices shown (2 of 3)]
[im 1/27]
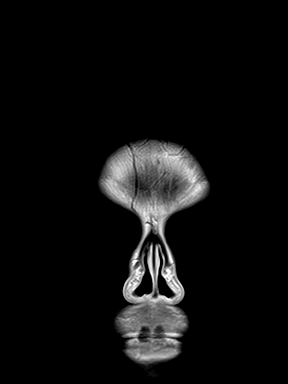

[Series 22: T1 post-contrast · sagittal · 5.0mm · 0.62mm/px · 1 of 25 slices shown (3 of 3)]
[im 1/25]
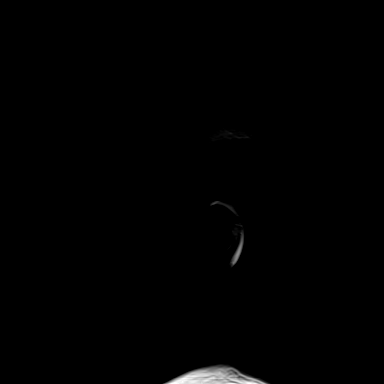

[48 of 48 positions shown; findings below may reference images not displayed]

FINDINGS: Brain: Cerebral volume within normal limits for patient age. No
focal parenchymal signal abnormality identified.

No abnormal foci of restricted diffusion to suggest acute or
subacute ischemia. Gray-white matter differentiation well
maintained. No encephalomalacia to suggest chronic infarction. No
foci of susceptibility artifact to suggest acute or chronic
intracranial hemorrhage.

No mass lesion, midline shift or mass effect. No hydrocephalus. No
extra-axial fluid collection. Major dural sinuses are grossly
patent.

Pituitary gland and suprasellar region are normal. Midline
structures intact and normal.

No abnormal enhancement.

Vascular: Major intracranial vascular flow voids well maintained and
normal in appearance.

Skull and upper cervical spine: Craniocervical junction normal.
Visualized upper cervical spine within normal limits. Bone marrow
signal intensity normal. No scalp soft tissue abnormality.

Sinuses/Orbits: Globes and orbital soft tissues within normal
limits.

Paranasal sinuses are clear. No mastoid effusion. Inner ear
structures normal.

Other: None.
IMPRESSION: Normal brain MRI. No acute intracranial abnormality identified.

## 2022-05-10 ENCOUNTER — Emergency Department: Payer: Medicaid Other

## 2022-05-10 ENCOUNTER — Emergency Department
Admission: EM | Admit: 2022-05-10 | Discharge: 2022-05-10 | Disposition: A | Discharge status: Home / Self Care | Payer: Medicaid Other | Attending: Emergency Medicine | Admitting: Emergency Medicine

## 2022-05-10 ENCOUNTER — Emergency Department (HOSPITAL_COMMUNITY): Payer: Medicaid Other

## 2022-05-10 DIAGNOSIS — R1084 Generalized abdominal pain: Secondary | ICD-10-CM

## 2022-05-10 DIAGNOSIS — R112 Nausea with vomiting, unspecified: Secondary | ICD-10-CM | POA: Diagnosis not present

## 2022-05-10 DIAGNOSIS — R519 Headache, unspecified: Secondary | ICD-10-CM | POA: Insufficient documentation

## 2022-05-10 DIAGNOSIS — Z9049 Acquired absence of other specified parts of digestive tract: Secondary | ICD-10-CM | POA: Insufficient documentation

## 2022-05-10 DIAGNOSIS — R1032 Left lower quadrant pain: Secondary | ICD-10-CM | POA: Insufficient documentation

## 2022-05-10 DIAGNOSIS — R197 Diarrhea, unspecified: Secondary | ICD-10-CM | POA: Diagnosis not present

## 2022-05-10 DIAGNOSIS — D72829 Elevated white blood cell count, unspecified: Secondary | ICD-10-CM | POA: Diagnosis not present

## 2022-05-10 DIAGNOSIS — R1030 Lower abdominal pain, unspecified: Secondary | ICD-10-CM | POA: Diagnosis present

## 2022-05-10 DIAGNOSIS — Z1152 Encounter for screening for COVID-19: Secondary | ICD-10-CM | POA: Insufficient documentation

## 2022-05-10 LAB — COMPREHENSIVE METABOLIC PANEL
ALT: 28 U/L (ref 0–44)
AST: 32 U/L (ref 15–41)
Albumin: 4.8 g/dL (ref 3.5–5.0)
Alkaline Phosphatase: 68 U/L (ref 38–126)
Anion gap: 7 (ref 5–15)
BUN: 17 mg/dL (ref 6–20)
CO2: 26 mmol/L (ref 22–32)
Calcium: 9.4 mg/dL (ref 8.9–10.3)
Chloride: 104 mmol/L (ref 98–111)
Creatinine, Ser: 0.75 mg/dL (ref 0.44–1.00)
GFR, Estimated: >60 mL/min (ref >60)
Glucose, Bld: 106 mg/dL — ABNORMAL HIGH (ref 70–99)
Potassium: 4.1 mmol/L (ref 3.5–5.1)
Sodium: 137 mmol/L (ref 135–145)
Total Bilirubin: 1.2 mg/dL (ref 0.3–1.2)
Total Protein: 8.6 g/dL — ABNORMAL HIGH (ref 6.5–8.1)

## 2022-05-10 LAB — URINALYSIS, ROUTINE W REFLEX MICROSCOPIC
Bilirubin Urine: NEGATIVE
Glucose, UA: NEGATIVE mg/dL
Hgb urine dipstick: NEGATIVE
Ketones, ur: NEGATIVE mg/dL
Leukocytes,Ua: NEGATIVE
Nitrite: NEGATIVE
Protein, ur: NEGATIVE mg/dL
Specific Gravity, Urine: 1.025 (ref 1.005–1.030)
pH: 5 (ref 5.0–8.0)

## 2022-05-10 LAB — CBC
HCT: 43 % (ref 36.0–46.0)
Hemoglobin: 14.6 g/dL (ref 12.0–15.0)
MCH: 28.8 pg (ref 26.0–34.0)
MCHC: 34 g/dL (ref 30.0–36.0)
MCV: 84.8 fL (ref 80.0–100.0)
Platelets: 261 10*3/uL (ref 150–400)
RBC: 5.07 MIL/uL (ref 3.87–5.11)
RDW: 12.3 % (ref 11.5–15.5)
WBC: 15.1 10*3/uL — ABNORMAL HIGH (ref 4.0–10.5)
nRBC: 0 % (ref 0.0–0.2)

## 2022-05-10 LAB — RESP PANEL BY RT-PCR (FLU A&B, COVID) ARPGX2
Influenza A by PCR: NEGATIVE
Influenza B by PCR: NEGATIVE
SARS Coronavirus 2 by RT PCR: NEGATIVE

## 2022-05-10 LAB — POC URINE PREG, ED: Preg Test, Ur: NEGATIVE

## 2022-05-10 LAB — LIPASE, BLOOD: Lipase: 37 U/L (ref 11–51)

## 2022-05-10 MED ORDER — FENTANYL CITRATE PF 50 MCG/ML IJ SOSY
25.0000 ug | PREFILLED_SYRINGE | Freq: Once | INTRAMUSCULAR | Status: AC
  Administered 2022-05-10: 25 ug via INTRAVENOUS
  Filled 2022-05-10: qty 1

## 2022-05-10 MED ORDER — KETOROLAC TROMETHAMINE 10 MG PO TABS: 10.0000 mg | ORAL_TABLET | Freq: Four times a day (QID) | ORAL | 0 refills | Status: AC | PRN

## 2022-05-10 MED ORDER — SODIUM CHLORIDE 0.9 % IV BOLUS
1000.0000 mL | Freq: Once | INTRAVENOUS | Status: AC
  Administered 2022-05-10: 1000 mL via INTRAVENOUS

## 2022-05-10 MED ORDER — ONDANSETRON HCL 4 MG/2ML IJ SOLN
4.0000 mg | INTRAMUSCULAR | Status: AC
  Administered 2022-05-10: 4 mg via INTRAVENOUS
  Filled 2022-05-10: qty 2

## 2022-05-10 MED ORDER — IOHEXOL 300 MG/ML  SOLN
100.0000 mL | Freq: Once | INTRAMUSCULAR | Status: AC | PRN
  Administered 2022-05-10: 100 mL via INTRAVENOUS

## 2022-05-10 MED ORDER — FENTANYL CITRATE PF 50 MCG/ML IJ SOSY
50.0000 ug | PREFILLED_SYRINGE | Freq: Once | INTRAMUSCULAR | Status: AC
  Administered 2022-05-10: 50 ug via INTRAVENOUS
  Filled 2022-05-10: qty 1

## 2022-05-10 MED ORDER — DICYCLOMINE HCL 10 MG PO CAPS: 10.0000 mg | ORAL_CAPSULE | Freq: Three times a day (TID) | ORAL | 0 refills | Status: AC

## 2022-05-10 MED ORDER — KETOROLAC TROMETHAMINE 30 MG/ML IJ SOLN
15.0000 mg | Freq: Once | INTRAMUSCULAR | Status: AC
  Administered 2022-05-10: 15 mg via INTRAVENOUS
  Filled 2022-05-10: qty 1

## 2022-05-10 MED ORDER — MORPHINE SULFATE (PF) 4 MG/ML IV SOLN
4.0000 mg | Freq: Once | INTRAVENOUS | Status: AC
  Administered 2022-05-10: 1 mg via INTRAVENOUS
  Filled 2022-05-10: qty 1

## 2022-05-10 MED ORDER — ONDANSETRON 4 MG PO TBDP: 4.0000 mg | ORAL_TABLET | Freq: Three times a day (TID) | ORAL | 0 refills | Status: AC | PRN

## 2022-05-10 NOTE — ED Notes (Addendum)
When this RN was giving pt IV morphine, pt stated she felt faint and laid back in bed. Pt appeared to faint for 10-15 seconds. IV morphine paused, pt VS remained stable on monitor, pt continued breathing normally. Pt revived and started speaking again, pt states she felt faint and felt strange as morphine was given. Pt was given 1 mg of morphine before this happened. No respiratory distress noted, no rash or itching, Dr Fanny Bien notified. Other 3 mg of morphine wasted in stericycle with Phineas Semen RN Per Dr Fanny Bien, hold the rest of IV morphine at this time.

## 2022-05-10 NOTE — ED Provider Notes (Signed)
-----------------------------------------   7:42 PM on 05/10/2022 -----------------------------------------  Blood pressure (!) 92/55, pulse 66, temperature 98.6 F (37 C), temperature source Oral, resp. rate 16, height 4\' 11"  (1.499 m), last menstrual period 04/22/2022, SpO2 98 %.  Assuming care from Dr. 13/06/2021.  In short, Carol Wilkerson is a 36 y.o. female with a chief complaint of Abdominal Pain .  Refer to the original H&P for additional details.  The current plan of care is to await ultrasound of the right upper quadrant and pelvis.  Patient presented with what appears to be GI symptoms with nausea, vomiting, loose stools.  Patient has had significant abdominal pain.  Labs are reassuring with most labs being in range.  Patient did have an elevated white blood cell count to 15.1.  Patient had a CT scan which showed a possible bile duct elation but Dr. 20 has already discussed this with general surgery.  They feel that it is unlikely given postcholecystectomy status and that she has a retained stone for the past 2 years.  They did recommend ultrasound for further evaluation.  Also given the abdominal pain pelvic ultrasound is being performed to ensure no evidence of torsion.  Currently awaiting results.  Patient has ongoing severe intractable pain we will admit for serial abdominal exams and pain relief.  If patient has improving symptoms after medication then she may be discharged.   ----------------------------------------- 9:17 PM on 05/10/2022 -----------------------------------------   Right upper quadrant ultrasound returns with reassuring results with no acute findings.  There is no biliary ductal dilation.  Pelvic ultrasound reveals no evidence of torsion.  Nabothian cyst on the cervix.   On my reassessment of the patient, she is resting comfortably at this time.  She states that after the medicine she is feeling much better.  With reassuring ultrasounds, reassuring  follow-up exam with no acute tenderness and no significant reported pain I feel the patient is stable for discharge.  She is had no ongoing emesis since my reassessment after handoff.  I will prescribe the patient Bentyl, anti-inflammatory and antiemetic.  I have given strict return precautions for hematemesis, hematochezia, worsening pain, inability to keep food and liquids down.  Patient is agreeable with this plan.   ED diagnosis:  Nausea, vomiting, diarrhea Generalized abdominal pain   05/12/2022, PA-C 05/10/22 2129    2130, MD 05/11/22 [phone]

## 2022-05-10 NOTE — ED Notes (Signed)
Pt still thrashing in pain. Provider at bedside. Called CT, she is 3rd in line.

## 2022-05-10 NOTE — ED Provider Notes (Signed)
[facility] Provider Note   Event Date/Time   First MD Initiated Contact with Patient 05/10/22 1444     (approximate)  History   Abdominal Pain Patient was initially listed as Spanish language.  However patient reports to me as well as the Spanish interpreter that she is fluent in [place], and we conversed in [place] without any difficulty.  Spanish interpreter was available though to assist with any questions that her husband whom is primarily Spanish-speaking had  HPI  Carol Wilkerson is a 36 y.o. female with a previous history of previous cholecystectomy and cesarean section, tubal ligation   Patient reports she was very much in her normal state of health until this morning.  She felt fine yesterday woke up this morning and noticed abdominal pain with nausea and vomiting.  She reports her stomach hurts especially across the middle to lower abdomen.  She has vomited a couple times, had slightly loose stool, as well as body aches.  She reports that her abdominal symptoms started then after vomiting a couple times she started also develop right sided throbbing headache which is typical of her migraines.  She has a history of migraines of same nature in the past and thinks that what ever is causing her stomach upset triggered a migraine.  No chest pain no trouble breathing.  No travel history.  No bloody stool  Currently reports nausea frequent vomiting, abdominal pain mostly across the lower abdomen.  It is severe in nature.  No noted pain or burning with urination.  No back pain.  Denies pregnancy.  No vaginal discharge or vaginal bleeding or unusual discharge.  Physical Exam   Triage Vital Signs: ED Triage Vitals  Enc Vitals Group     BP 05/10/22 1400 (!) 128/100     Pulse Rate 05/10/22 1400 86     Resp 05/10/22 1400 18     Temp 05/10/22 1359 98.5 F (36.9 C)     Temp src --      SpO2 05/10/22 1400 100 %     Weight --      Height 05/10/22 1400 4'  11" (1.499 m)     Head Circumference --      Peak Flow --      Pain Score 05/10/22 1400 10     Pain Loc --      Pain Edu? --      Excl. in GC? --     Most recent vital signs: Vitals:   05/10/22 1735 05/10/22 1839  BP: 118/67 (!) 92/55  Pulse: 65 66  Resp: 18 16  Temp:  98.6 F (37 C)  SpO2: 99% 98%     General: Awake, appears in moderate pain and mildly ill.  She is holding her hand over her lower abdomen appears to be in pain reporting lower abdominal pain and nausea CV:  Good peripheral perfusion.  Normal heart tones.  No murmurs rubs or gallops Resp:  Normal effort.  Clear lungs bilaterally.  Normal work of breathing. Abd:  No distention.  Moderate to severe tenderness to palpation across the lower abdomen, reports pain in McBurney's point but also reports moderate pain to palpation left lower quadrant.  There is no rebound or guarding in any area, but it does appear to have focality involving the lower abdomen in particular.  Negative Murphy.  No pain elicited in the right upper quadrant or left upper quadrant. Other:  Posterior oropharynx appears normal.  No tonsillar exudates  or hypertrophy.  No injection.   ED Results / Procedures / Treatments   Labs (all labs ordered are listed, but only abnormal results are displayed) Labs Reviewed  COMPREHENSIVE METABOLIC PANEL - Abnormal; Notable for the following components:      Result Value   Glucose, Bld 106 (*)    Total Protein 8.6 (*)    All other components within normal limits  CBC - Abnormal; Notable for the following components:   WBC 15.1 (*)    All other components within normal limits  URINALYSIS, ROUTINE W REFLEX MICROSCOPIC - Abnormal; Notable for the following components:   Color, Urine YELLOW (*)    APPearance CLEAR (*)    All other components within normal limits  RESP PANEL BY RT-PCR (FLU A&B, COVID) ARPGX2  LIPASE, BLOOD  POC URINE PREG, ED     RADIOLOGY  CT ABDOMEN PELVIS W CONTRAST  Result Date:  05/10/2022 CLINICAL DATA:  Acute abdominal pain, right lower quadrant pain. EXAM: CT ABDOMEN AND PELVIS WITH CONTRAST TECHNIQUE: Multidetector CT imaging of the abdomen and pelvis was performed using the standard protocol following bolus administration of intravenous contrast. RADIATION DOSE REDUCTION: This exam was performed according to the departmental dose-optimization program which includes automated exposure control, adjustment of the mA and/or kV according to patient size and/or use of iterative reconstruction technique. CONTRAST:  100mL OMNIPAQUE IOHEXOL 300 MG/ML  SOLN COMPARISON:  MRI abdomen 04/03/2020 FINDINGS: Lower chest: No acute abnormality. Hepatobiliary: No focal liver abnormality is seen. Status post cholecystectomy. Common bile duct measures 1 cm. No intrahepatic biliary ductal dilatation. Pancreas: Unremarkable. No pancreatic ductal dilatation or surrounding inflammatory changes. Spleen: Normal in size without focal abnormality. Adrenals/Urinary Tract: Adrenal glands are unremarkable. Kidneys are normal, without renal calculi, focal lesion, or hydronephrosis. Bladder is unremarkable. Stomach/Bowel: Stomach is within normal limits. Appendix appears normal. No evidence of bowel wall thickening, distention, or inflammatory changes. There are scattered colonic diverticula. Vascular/Lymphatic: No significant vascular findings are present. No enlarged abdominal or pelvic lymph nodes. Reproductive: Uterus and bilateral adnexa are unremarkable. Other: No abdominal wall hernia or abnormality. No abdominopelvic ascites. Musculoskeletal: No acute or significant osseous findings. IMPRESSION: 1. No acute localizing process in the abdomen or pelvis. 2. Colonic diverticulosis without evidence for diverticulitis. 3. There is common bile duct dilatation which may be within normal limits in this patient status post cholecystectomy. Recommend correlation with lab values. Electronically Signed   By: Darliss CheneyAmy  Guttmann  M.D.   On: 05/10/2022 18:18    US pending, follow-up will be with Gala RomneyJonathan Cuthriell pa  CT abdomen pelvis ordered as the patient has leukocytosis with associated lower abdominal pain.  PROCEDURES:  Critical Care performed: No Procedures   MEDICATIONS ORDERED IN ED: Medications  ketorolac (TORADOL) 30 MG/ML injection 15 mg (15 mg Intravenous Given 05/10/22 1618)  morphine (PF) 4 MG/ML injection 4 mg (1 mg Intravenous Given 05/10/22 1621)  sodium chloride 0.9 % bolus 1,000 mL (0 mLs Intravenous Stopped 05/10/22 1708)  ondansetron (ZOFRAN) injection 4 mg (4 mg Intravenous Given 05/10/22 1617)  iohexol (OMNIPAQUE) 300 MG/ML solution 100 mL (100 mLs Intravenous Contrast Given 05/10/22 1758)  fentaNYL (SUBLIMAZE) injection 50 mcg (50 mcg Intravenous Given 05/10/22 1714)  fentaNYL (SUBLIMAZE) injection 25 mcg (25 mcg Intravenous Given 05/10/22 1733)  ketorolac (TORADOL) 30 MG/ML injection 15 mg (15 mg Intravenous Given 05/10/22 1841)  sodium chloride 0.9 % bolus 1,000 mL (1,000 mLs Intravenous New Bag/Given 05/10/22 1851)     IMPRESSION / MDM / ASSESSMENT  AND PLAN / ED COURSE  I reviewed the triage vital signs and the nursing notes.                              Differential diagnosis includes, but is not limited to, appendicitis, diverticulitis, acute intra-abdominal infection, gastroenteritis viral pathology colitis etc.  She does not endorse any acute urinary symptoms her urinalysis shows no evidence of urinary infection.  Her labs do show significant leukocytosis, and with her associated lower abdominal pain nausea vomiting and loose stools I suspect this is likely an acute gastrointestinal etiology.  Given the associated pain and leukocytosis we will proceed with CT imaging to help exclude causes such as diverticulitis colitis appendicitis pancreatitis etc.  Normal LFTs.  Normal lipase.  Previous cholecystectomy  Patient reports symptoms that seem to be most suggestive of acute  intra-abdominal etiology.  She denies any vaginal discharge or bleeding.  Low pretest probability for gynecologic etiology.  Patient's presentation is most consistent with acute complicated illness / injury requiring diagnostic workup.  The patient is on the pulse oximetry monitor to evaluate for evidence of arrhythmia and/or significant heart rate changes.  Labs interpreted as leukocytosis.   ----------------------------------------- 5:21 PM on 05/10/2022 ----------------------------------------- Vitals:   05/10/22 1735 05/10/22 1839  BP: 118/67 (!) 92/55  Pulse: 65 66  Resp: 18 16  Temp:  98.6 F (37 C)  SpO2: 99% 98%   Vital signs remained stable but the patient reporting severe recurrence of pain, was medicated with 50 mics of fentanyl approximately 15 minutes ago.  On reassessment the patient is alert and oriented, but writhing in pain.  She had reported the Toradol provided relief of pain, but now it is come back strongly and she feels a severe pain in her mid abdomen.  She is diaphoretic and writhing in pain, I just given her 50 mcg of fentanyl for the pain and it is having only minimal effect.  She appears quite anxious as well.  Will provide additional fentanyl and continue to monitor her closely.  CT advised that she should be able to have imaging soon, she is currently third in priority.  She does not appear to have evidence of acute physiologic instability or shock, but does appear to be responding to severe pain in her mid abdomen at this time which we are attempting to control.  ----------------------------------------- 6:32 PM on 05/10/2022 -----------------------------------------  Patient reports she had brief improvement in pain, but is now once again recurring.  Earlier she expressed that Toradol is more helpful for pain control, will give additional dose of Toradol  CT findings did not show obvious acute concern.  Will ultrasound and rule out ovarian torsion though  this seems unlikely clinically, and also obtain right upper quadrant ultrasound to further evaluate the biliary tree though I did discuss her case and presentation with Dr. Tonna Boehringer and given her presentation, her cholecystectomy being 2 years ago, and her lab work including normal LFTs to this point he advises it would seem extremely unlikely this is representative of an acute biliary obstructive type process or an acute surgical need.  I think this is agreeable.  If the patient's pain continues and her imaging that is pending is normal I think I would offer her admission for further serial examinations and work-up under the hospitalist service.  Ongoing planus care assigned to physician assistant, Gala Romney.  Following up on right upper quadrant ultrasound imaging and transvaginal  ultrasound for exclusion of ovarian torsion.  If these studies are reassuring or normal, would recommend reassessment of patient's symptoms and if she remains with moderate or severe symptoms would recommend admission for serial examination and further work-up under the hospitalist service.  If the patient's symptoms of notably improved she feels better, and appears appropriate for discharge we could also consider discharge with abdominal pain return precautions and anticipate prescription antiemetic  FINAL CLINICAL IMPRESSION(S) / ED DIAGNOSES   Final diagnoses:  Nausea vomiting and diarrhea     Rx / DC Orders   ED Discharge Orders     None        Note:  This document was prepared using Dragon voice recognition software and may include unintentional dictation errors.   Sharyn Creamer, MD 05/10/22 2014

## 2022-05-10 NOTE — ED Notes (Signed)
EDP notified about VS

## 2022-05-10 NOTE — ED Notes (Signed)
Dr Fanny Bien at bedside again.

## 2022-05-10 NOTE — ED Triage Notes (Signed)
Pt to ED for mid lower abd pain that started this am, with diarrhea and emesis. +headache. Has not taken any medications.

## 2023-03-04 ENCOUNTER — Other Ambulatory Visit: Payer: Self-pay

## 2023-03-04 ENCOUNTER — Emergency Department
Admission: EM | Admit: 2023-03-04 | Discharge: 2023-03-04 | Disposition: A | Discharge status: Home / Self Care | Payer: Medicaid Other | Attending: Student in an Organized Health Care Education/Training Program | Admitting: Student in an Organized Health Care Education/Training Program

## 2023-03-04 DIAGNOSIS — M545 Low back pain, unspecified: Secondary | ICD-10-CM | POA: Diagnosis present

## 2023-03-04 DIAGNOSIS — S39012A Strain of muscle, fascia and tendon of lower back, initial encounter: Secondary | ICD-10-CM | POA: Insufficient documentation

## 2023-03-04 DIAGNOSIS — X58XXXA Exposure to other specified factors, initial encounter: Secondary | ICD-10-CM | POA: Diagnosis not present

## 2023-03-04 LAB — URINALYSIS, COMPLETE (UACMP) WITH MICROSCOPIC
Bilirubin Urine: NEGATIVE
Glucose, UA: NEGATIVE mg/dL
Hgb urine dipstick: NEGATIVE
Ketones, ur: NEGATIVE mg/dL
Leukocytes,Ua: NEGATIVE
Nitrite: NEGATIVE
Protein, ur: NEGATIVE mg/dL
RBC / HPF: 0 RBC/hpf (ref 0–5)
Specific Gravity, Urine: 1.018 (ref 1.005–1.030)
pH: 6 (ref 5.0–8.0)

## 2023-03-04 LAB — BASIC METABOLIC PANEL
Anion gap: 10 (ref 5–15)
BUN: 20 mg/dL (ref 6–20)
CO2: 25 mmol/L (ref 22–32)
Calcium: 9.2 mg/dL (ref 8.9–10.3)
Chloride: 102 mmol/L (ref 98–111)
Creatinine, Ser: 0.73 mg/dL (ref 0.44–1.00)
GFR, Estimated: >60 mL/min (ref >60)
Glucose, Bld: 94 mg/dL (ref 70–99)
Potassium: 3.5 mmol/L (ref 3.5–5.1)
Sodium: 137 mmol/L (ref 135–145)

## 2023-03-04 LAB — CBC
HCT: 36.7 % (ref 36.0–46.0)
Hemoglobin: 12.8 g/dL (ref 12.0–15.0)
MCH: 29.5 pg (ref 26.0–34.0)
MCHC: 34.9 g/dL (ref 30.0–36.0)
MCV: 84.6 fL (ref 80.0–100.0)
Platelets: 236 10*3/uL (ref 150–400)
RBC: 4.34 MIL/uL (ref 3.87–5.11)
RDW: 12.3 % (ref 11.5–15.5)
WBC: 10.2 10*3/uL (ref 4.0–10.5)
nRBC: 0 % (ref 0.0–0.2)

## 2023-03-04 LAB — POC URINE PREG, ED: Preg Test, Ur: NEGATIVE

## 2023-03-04 MED ORDER — KETOROLAC TROMETHAMINE 30 MG/ML IJ SOLN
30.0000 mg | Freq: Once | INTRAMUSCULAR | Status: AC
  Administered 2023-03-04: 30 mg via INTRAMUSCULAR
  Filled 2023-03-04: qty 1

## 2023-03-04 MED ORDER — METHOCARBAMOL 500 MG PO TABS
500.0000 mg | ORAL_TABLET | Freq: Once | ORAL | Status: AC
  Administered 2023-03-04: 500 mg via ORAL
  Filled 2023-03-04: qty 1

## 2023-03-04 MED ORDER — CYCLOBENZAPRINE HCL 5 MG PO TABS
5.0000 mg | ORAL_TABLET | Freq: Three times a day (TID) | ORAL | 0 refills | Status: AC | PRN
Start: 2023-03-04 — End: ?

## 2023-03-04 NOTE — ED Provider Notes (Signed)
[place] EMERGENCY DEPARTMENT AT [facility] Provider Note   CSN: [id number] Arrival date & time: 03/04/23  1332     History  Chief Complaint  Patient presents with   Back Pain    Carol Wilkerson is a 37 y.o. female.  Presents to the emergency department for evaluation of acute lower back pain.  2 days ago she woke up with pain in the middle of her lower back, she describes right sided muscle tightness, pain that is moderate to severe worse with movement.  No numbness tingling radicular symptoms.  She denies any fevers chills body aches, abdominal pain nausea or vomiting.  She has been taking meloxicam with little relief.  No history of kidney stones.  HPI     Home Medications Prior to Admission medications   Medication Sig Start Date End Date Taking? Authorizing Provider  cyclobenzaprine (FLEXERIL) 5 MG tablet Take 1-2 tablets (5-10 mg total) by mouth 3 (three) times daily as needed. 03/04/23  Yes Evon Slack, PA-C  dicyclomine (BENTYL) 10 MG capsule Take 1 capsule (10 mg total) by mouth 4 (four) times daily -  before meals and at bedtime. 05/10/22   Cuthriell, Delorise Royals, PA-C  ibuprofen (ADVIL) 800 MG tablet Take 1 tablet (800 mg total) by mouth every 8 (eight) hours as needed. 04/05/20   Donovan Kail, PA-C  ketorolac (TORADOL) 10 MG tablet Take 1 tablet (10 mg total) by mouth every 6 (six) hours as needed. 05/10/22   Cuthriell, Delorise Royals, PA-C  omeprazole (PRILOSEC) 20 MG capsule Take 20 mg by mouth daily. 01/11/20   [provider]  ondansetron (ZOFRAN-ODT) 4 MG disintegrating tablet Take 1 tablet (4 mg total) by mouth every 8 (eight) hours as needed. 05/10/22   Cuthriell, Delorise Royals, PA-C      Allergies    Patient has no known allergies.    Review of Systems   Review of Systems  Physical Exam Updated Vital Signs BP (!) 134/94 (BP Location: Right Arm)   Pulse 77   Temp 98.9 F (37.2 C) (Oral)   Resp 17   Ht 4\' 11"  (1.499 m)   Wt  77.1 kg   SpO2 100%   BMI 34.34 kg/m  Physical Exam Constitutional:      Appearance: She is well-developed.  HENT:     Head: Normocephalic and atraumatic.  Eyes:     Conjunctiva/sclera: Conjunctivae normal.  Cardiovascular:     Rate and Rhythm: Normal rate.  Pulmonary:     Effort: Pulmonary effort is normal. No respiratory distress.  Abdominal:     General: Bowel sounds are normal. There is no distension.     Tenderness: There is no abdominal tenderness. There is no right CVA tenderness, left CVA tenderness or guarding.  Musculoskeletal:        General: Normal range of motion.     Cervical back: Normal range of motion.     Comments: Lumbar Spine: Examination of the lumbar spine reveals no bony abnormality, no edema, and no ecchymosis.  There is no step off.  The patient has full range of motion of the lumbar spine with flexion and extension.  The patient has normal lateral bend and rotation.  The patient has no pain with range of motion activities.  The patient has a negative axial load test, and a negative rotational Waddell test.  The patient is non tender along the spinous process.  Tender along the right paravertebral muscles of the lumbar spine.  No swelling warmth redness the patient is non tender along the iliac crest.  The patient is non tender in the sciatic notch.  The patient is non tender along the Sacroiliac joint.  There is no Coccyx joint tenderness.    Bilateral Lower Extremities: Examination of the lower extremities reveals no bony abnormality, no edema, and no ecchymosis.  The patient has full active and passive range of motion of the hips, knees, and ankles.  There is no discomfort with range of motion exercises.  The patient is non tender along the greater trochanter region.  The patient has a negative Denna Haggard' test bilaterally.  There is normal skin warmth.  There is normal capillary refill bilaterally.    Neurologic: The patient has a negative straight leg raise.  The  patient has normal muscle strength testing for the quadriceps, calves, ankle dorsiflexion, ankle plantarflexion, and extensor hallicus longus.  The patient has sensation that is intact to light touch.  The deep tendon reflexes are nor   Skin:    General: Skin is warm.     Findings: No rash.  Neurological:     Mental Status: She is alert and oriented to person, place, and time.  Psychiatric:        Behavior: Behavior normal.        Thought Content: Thought content normal.     ED Results / Procedures / Treatments   Labs (all labs ordered are listed, but only abnormal results are displayed) Labs Reviewed  URINALYSIS, COMPLETE (UACMP) WITH MICROSCOPIC - Abnormal; Notable for the following components:      Result Value   Color, Urine YELLOW (*)    APPearance CLEAR (*)    Bacteria, UA RARE (*)    All other components within normal limits  CBC  BASIC METABOLIC PANEL  POC URINE PREG, ED    EKG None  Radiology No results found.  Procedures Procedures    Medications Ordered in ED Medications  ketorolac (TORADOL) 30 MG/ML injection 30 mg (30 mg Intramuscular Given 03/04/23 1700)  methocarbamol (ROBAXIN) tablet 500 mg (500 mg Oral Given 03/04/23 1701)    ED Course/ Medical Decision Making/ A&P                                 Medical Decision Making Amount and/or Complexity of Data Reviewed Labs: ordered.  Risk Prescription drug management.   37 year old female with right lower back pain.  Afebrile, vital signs are stable.  No abdominal pain or radiation of pain.  Pain consistent with mechanical muscular pain.  Urinalysis negative for any blood or infection.  CBC and CMP within normal limits.  Urine pregnancy test negative.  Patient and with improved back pain following Toradol and methocarbamol.  She will continue with Tylenol, meloxicam and will prescribe Flexeril.  She understands signs symptoms return to the ER for. Final Clinical Impression(s) / ED Diagnoses Final  diagnoses:  Strain of lumbar region, initial encounter  Acute right-sided low back pain without sciatica    Rx / DC Orders ED Discharge Orders          Ordered    cyclobenzaprine (FLEXERIL) 5 MG tablet  3 times daily PRN        03/04/23 1723              Evon Slack, PA-C 03/04/23 1726    Willy Eddy, MD 03/04/23 1739

## 2023-03-04 NOTE — Discharge Instructions (Signed)
Please take Tylenol every 6 hours along with meloxicam daily.  Use muscle relaxers as needed for muscle pain.  Use a heating pad as needed for comfort.  Avoid any heavy lifting pushing or pulling.  Follow-up with primary care provider or orthopedics if no improvement 1 week.  Return to the ER for any worsening symptoms or any urgent changes in your health.

## 2023-03-04 NOTE — ED Notes (Signed)
See triage note  Presents with lower back pain  states she woke up with the pain  denies and specific injury    Thinks she may have slept wrong

## 2023-03-04 NOTE — ED Triage Notes (Signed)
Pt presents to ED with lower back pain that started 2 days ago., Pt denies urinary s/s. Pt denies injury or trauma. Pt ambulatory to triage with steady gait.

## 2023-05-03 ENCOUNTER — Encounter: Payer: Self-pay | Admitting: Emergency Medicine

## 2023-05-03 ENCOUNTER — Emergency Department: Payer: Medicaid Other

## 2023-05-03 ENCOUNTER — Emergency Department
Admission: EM | Admit: 2023-05-03 | Discharge: 2023-05-03 | Disposition: A | Discharge status: Home / Self Care | Payer: Medicaid Other | Attending: Emergency Medicine | Admitting: Emergency Medicine

## 2023-05-03 ENCOUNTER — Other Ambulatory Visit: Payer: Self-pay

## 2023-05-03 DIAGNOSIS — M542 Cervicalgia: Secondary | ICD-10-CM | POA: Insufficient documentation

## 2023-05-03 DIAGNOSIS — R109 Unspecified abdominal pain: Secondary | ICD-10-CM

## 2023-05-03 DIAGNOSIS — Y92410 Unspecified street and highway as the place of occurrence of the external cause: Secondary | ICD-10-CM | POA: Diagnosis not present

## 2023-05-03 DIAGNOSIS — K760 Fatty (change of) liver, not elsewhere classified: Secondary | ICD-10-CM | POA: Diagnosis not present

## 2023-05-03 DIAGNOSIS — N6310 Unspecified lump in the right breast, unspecified quadrant: Secondary | ICD-10-CM | POA: Insufficient documentation

## 2023-05-03 LAB — CBC WITH DIFFERENTIAL/PLATELET
Abs Immature Granulocytes: 0.03 10*3/uL (ref 0.00–0.07)
Basophils Absolute: 0 10*3/uL (ref 0.0–0.1)
Basophils Relative: 0 %
Eosinophils Absolute: 0.2 10*3/uL (ref 0.0–0.5)
Eosinophils Relative: 2 %
HCT: 39.5 % (ref 36.0–46.0)
Hemoglobin: 13.4 g/dL (ref 12.0–15.0)
Immature Granulocytes: 0 %
Lymphocytes Relative: 30 %
Lymphs Abs: 2.4 10*3/uL (ref 0.7–4.0)
MCH: 29.3 pg (ref 26.0–34.0)
MCHC: 33.9 g/dL (ref 30.0–36.0)
MCV: 86.4 fL (ref 80.0–100.0)
Monocytes Absolute: 0.6 10*3/uL (ref 0.1–1.0)
Monocytes Relative: 8 %
Neutro Abs: 4.7 10*3/uL (ref 1.7–7.7)
Neutrophils Relative %: 60 %
Platelets: 244 10*3/uL (ref 150–400)
RBC: 4.57 MIL/uL (ref 3.87–5.11)
RDW: 12.3 % (ref 11.5–15.5)
WBC: 7.9 10*3/uL (ref 4.0–10.5)
nRBC: 0 % (ref 0.0–0.2)

## 2023-05-03 LAB — BASIC METABOLIC PANEL
Anion gap: 6 (ref 5–15)
BUN: 17 mg/dL (ref 6–20)
CO2: 25 mmol/L (ref 22–32)
Calcium: 9 mg/dL (ref 8.9–10.3)
Chloride: 105 mmol/L (ref 98–111)
Creatinine, Ser: 0.93 mg/dL (ref 0.44–1.00)
GFR, Estimated: >60 mL/min (ref >60)
Glucose, Bld: 86 mg/dL (ref 70–99)
Potassium: 3.9 mmol/L (ref 3.5–5.1)
Sodium: 136 mmol/L (ref 135–145)

## 2023-05-03 MED ORDER — FENTANYL CITRATE PF 50 MCG/ML IJ SOSY
50.0000 ug | PREFILLED_SYRINGE | Freq: Once | INTRAMUSCULAR | Status: AC
  Administered 2023-05-03: 50 ug via INTRAVENOUS
  Filled 2023-05-03: qty 1

## 2023-05-03 MED ORDER — OXYCODONE-ACETAMINOPHEN 5-325 MG PO TABS
1.0000 | ORAL_TABLET | ORAL | 0 refills | Status: AC | PRN
Start: 2023-05-03 — End: 2023-05-06

## 2023-05-03 MED ORDER — IOHEXOL 300 MG/ML  SOLN
100.0000 mL | Freq: Once | INTRAMUSCULAR | Status: AC | PRN
  Administered 2023-05-03: 100 mL via INTRAVENOUS

## 2023-05-03 MED ORDER — MORPHINE SULFATE (PF) 4 MG/ML IV SOLN
4.0000 mg | Freq: Once | INTRAVENOUS | Status: AC
  Administered 2023-05-03: 4 mg via INTRAVENOUS
  Filled 2023-05-03: qty 1

## 2023-05-03 MED ORDER — OXYCODONE-ACETAMINOPHEN 5-325 MG PO TABS
1.0000 | ORAL_TABLET | Freq: Once | ORAL | Status: AC
  Administered 2023-05-03: 1 via ORAL
  Filled 2023-05-03: qty 1

## 2023-05-03 MED ORDER — OXYCODONE-ACETAMINOPHEN 5-325 MG PO TABS
1.0000 | ORAL_TABLET | ORAL | 0 refills | Status: DC | PRN
Start: 2023-05-03 — End: 2023-05-03
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
## 2023-05-03 NOTE — ED Notes (Signed)
Pt taken to bathroom via wheelchair and returned to room - pt able to stand and pivot x4. Urine sample sent to lab with pt label.
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
## 2024-06-27 ENCOUNTER — Emergency Department

## 2024-06-27 ENCOUNTER — Other Ambulatory Visit: Payer: Self-pay

## 2024-06-27 ENCOUNTER — Emergency Department
Admission: EM | Admit: 2024-06-27 | Discharge: 2024-06-27 | Disposition: A | Discharge status: Home / Self Care | Attending: Emergency Medicine | Admitting: Emergency Medicine

## 2024-06-27 ENCOUNTER — Encounter: Payer: Self-pay | Admitting: Emergency Medicine

## 2024-06-27 DIAGNOSIS — M25461 Effusion, right knee: Secondary | ICD-10-CM | POA: Diagnosis not present

## 2024-06-27 DIAGNOSIS — M25561 Pain in right knee: Secondary | ICD-10-CM | POA: Diagnosis present

## 2024-06-27 MED ORDER — PREDNISONE 50 MG PO TABS: 50.0000 mg | ORAL_TABLET | Freq: Every day | ORAL | 0 refills | Status: AC

## 2024-06-27 MED ORDER — PREDNISONE 50 MG PO TABS: 50.0000 mg | ORAL_TABLET | Freq: Every day | ORAL | 0 refills | Status: DC

## 2024-06-27 MED ORDER — NAPROXEN 500 MG PO TBEC: 500.0000 mg | DELAYED_RELEASE_TABLET | Freq: Two times a day (BID) | ORAL | 0 refills | Status: DC

## 2024-06-27 MED ORDER — NAPROXEN 500 MG PO TBEC: 500.0000 mg | DELAYED_RELEASE_TABLET | Freq: Two times a day (BID) | ORAL | 0 refills | Status: AC

## 2024-06-27 MED ORDER — KETOROLAC TROMETHAMINE 30 MG/ML IJ SOLN
30.0000 mg | Freq: Once | INTRAMUSCULAR | Status: AC
  Administered 2024-06-27: 30 mg via INTRAMUSCULAR
  Filled 2024-06-27: qty 1

## 2024-06-27 MED ORDER — PREDNISONE 20 MG PO TABS
60.0000 mg | ORAL_TABLET | Freq: Once | ORAL | Status: AC
  Administered 2024-06-27: 60 mg via ORAL
  Filled 2024-06-27: qty 3

## 2024-06-27 NOTE — Discharge Instructions (Signed)
 You have been diagnosed with right knee effusion.  Please take prednisone  1 tablet with breakfast for the next 3 days.  You can take naproxen  1 tablet after breakfast and after dinner.  Please go to [facility].  Please come back to you go to the PCP if you have new symptoms symptoms worsen.  It was a pleasure to help you today.  Sophi Calligan Downs PA-C

## 2024-06-27 NOTE — ED Provider Notes (Signed)
 "  [facility] Provider Note    Event Date/Time   First MD Initiated Contact with Patient 06/27/24 2003     (approximate)   History   Knee Pain    HPI  Carol Wilkerson is a 39 y.o. female    with a past medical history of syphilis, right wrist pain, chronic headache, typical radiculopathy, GERD, plantar fasciitis,,  who presents to the ED complaining of right knee pain since Sunday. According to the patient, started 5 days ago with pain in the right knee after turning around while working.  The last 24 hours patient states having right lower extremity edema and pain.  Patient endorses pain limits flex and extension.  Patient is here with her husband.  Patient Active Problem List   Diagnosis Date Noted   Acute cholecystitis 04/02/2020   Elevated LFTs 03/04/2019   Obesity (BMI 30-39.9) 03/04/2019   Pneumonia due to COVID-19 virus 03/04/2019    Physical Exam   Triage Vital Signs: ED Triage Vitals  Encounter Vitals Group     BP 06/27/24 1631 102/85     Girls Systolic BP Percentile --      Girls Diastolic BP Percentile --      Boys Systolic BP Percentile --      Boys Diastolic BP Percentile --      Pulse Rate 06/27/24 1631 77     Resp 06/27/24 1631 16     Temp 06/27/24 1631 98.3 F (36.8 C)     Temp Source 06/27/24 1631 Oral     SpO2 06/27/24 1631 100 %     Weight 06/27/24 1632 169 lb 12.1 oz (77 kg)     Height 06/27/24 1632 4' 11 (1.499 m)     Head Circumference --      Peak Flow --      Pain Score 06/27/24 1632 10     Pain Loc --      Pain Education --      Exclude from Growth Chart --     Most recent vital signs: Vitals:   06/27/24 1631 06/27/24 2052  BP: 102/85 134/75  Pulse: 77 70  Resp: 16 16  Temp: 98.3 F (36.8 C) 98 F (36.7 C)  SpO2: 100% 100%     Physical Exam Vitals and nursing note reviewed.  In triage vital signs were normal.  General:          Awake, no distress.  CV:                  Good peripheral  perfusion. Regular rate and rhythm. Resp:               Normal effort. no tachypnea.Equal breath sounds bilaterally.  Abd:                 No distention.  Soft nontender Other:       Right lower extremity: Right knee: Skin is intact, no ecchymosis no hematoma no lacerations.  Tenderness to palpation on at the level of the medial side.  Edema.  Full ROM limited by pain and edema.  Presence of edema and tenderness to palpation in the and anterior proximal after of the tibia.  No tenderness to palpation of the calf.  Pulses are positive.  Negative anterior drawer, negative posterior drawer.        ED Results / Procedures / Treatments   Labs (all labs ordered are listed, but only abnormal results are displayed) Labs  Reviewed - No data to display    RADIOLOGY I independently reviewed and interpreted imaging and agree with radiologists findings.     PROCEDURES:  Critical Care performed:   Procedures   MEDICATIONS ORDERED IN ED: Medications  ketorolac  (TORADOL ) 30 MG/ML injection 30 mg (30 mg Intramuscular Given 06/27/24 2047)  predniSONE  (DELTASONE ) tablet 60 mg (60 mg Oral Given 06/27/24 2047)   Clinical Course as of 06/27/24 2208  Tue Jun 27, 2024  2005 DG Knee Complete 4 Views Right Small RIGHT knee joint effusion. [AE]  2206 US  Venous Img Lower Right (DVT Study) Negative. [AE]    Clinical Course User Index [AE] Janit Kast, PA-C    IMPRESSION / MDM / ASSESSMENT AND PLAN / ED COURSE  I reviewed the triage vital signs and the nursing notes.  Differential diagnosis includes, but is not limited to, DVT, thrombophlebitis, fracture, right knee soft tissue injury, joint effusion.  Patient's presentation is most consistent with acute complicated illness / injury requiring diagnostic workup.   Carol Wilkerson is a 39 y.o., female who presents today with history of 5 days of right knee pain.  See HPI for further information.  On a physical exam vital signs were normal.   Cardiopulmonary is clear.  Right lower extremity, skin is intact, no ecchymosis, no hematomas, no lacerations.  Tenderness to palpation in the medial side of the knee.  Edema.  Full ROM limited by pain and edema.  Presence of edema in the proximal third of the tibia.  Tender to palpation.  No erythema, no warmness.  Rest of the physical exam is normal. Plan Prednisone  Toradol  IM Right lower extremity ultrasound Reassess US  venous right lower extremity negative for DVT. Patient's diagnosis is consistent with right knee effusion. I independently reviewed and interpreted imaging and agree with radiologists findings. I did review the patient's allergies and medications.The patient is in stable and satisfactory condition for discharge home  Patient will be discharged home with prescriptions for prednisone , naproxen . Patient is to follow up with [facility] as needed or otherwise directed. Patient is given ED precautions to return to the ED for any worsening or new symptoms.  Work note will be provided.  Patient is going to be discharged with knee immobilizer. Discussed plan of care with patient, answered all of patient's questions, patient agreeable to plan of care. Advised patient to take medications according to the instructions on the label. Discussed possible side effects of new medications. Patient verbalized understanding.  FINAL CLINICAL IMPRESSION(S) / ED DIAGNOSES   Final diagnoses:  Effusion of right knee     Rx / DC Orders   ED Discharge Orders     None        Note:  This document was prepared using Dragon voice recognition software and may include unintentional dictation errors.   Janit Kast, PA-C 06/27/24 2211    Bradler, Evan K, MD 06/27/24 2318  "

## 2024-06-27 NOTE — ED Triage Notes (Signed)
 Pt reports right knee pain since Sunday. States she was cooking when it started as a cramp but now worsening.
